# Patient Record
Sex: Male | Born: 1940 | Race: White | Hispanic: No | Marital: Married | Smoking: Former smoker
Health system: Southern US, Community
[De-identification: ages and names within clinical notes are randomized; demographics above are authoritative.]

## PROBLEM LIST (undated history)

## (undated) DIAGNOSIS — I700 Atherosclerosis of aorta: Secondary | ICD-10-CM

## (undated) DIAGNOSIS — I2510 Atherosclerotic heart disease of native coronary artery without angina pectoris: Secondary | ICD-10-CM

## (undated) DIAGNOSIS — Z8673 Personal history of transient ischemic attack (TIA), and cerebral infarction without residual deficits: Secondary | ICD-10-CM

## (undated) DIAGNOSIS — I639 Cerebral infarction, unspecified: Secondary | ICD-10-CM

## (undated) DIAGNOSIS — Z9289 Personal history of other medical treatment: Secondary | ICD-10-CM

## (undated) DIAGNOSIS — Z8679 Personal history of other diseases of the circulatory system: Secondary | ICD-10-CM

## (undated) DIAGNOSIS — I480 Paroxysmal atrial fibrillation: Secondary | ICD-10-CM

## (undated) DIAGNOSIS — H269 Unspecified cataract: Secondary | ICD-10-CM

## (undated) HISTORY — DX: Atherosclerosis of aorta: I70.0

## (undated) HISTORY — DX: Paroxysmal atrial fibrillation: I48.0

## (undated) HISTORY — PX: OTHER SURGICAL HISTORY: SHX169

## (undated) HISTORY — DX: Atherosclerotic heart disease of native coronary artery without angina pectoris: I25.10

## (undated) HISTORY — DX: Personal history of transient ischemic attack (TIA), and cerebral infarction without residual deficits: Z86.73

## (undated) HISTORY — DX: Cerebral infarction, unspecified: I63.9

## (undated) HISTORY — DX: Personal history of other diseases of the circulatory system: Z86.79

## (undated) HISTORY — DX: Personal history of other medical treatment: Z92.89

---

## 2017-01-02 DIAGNOSIS — Z8673 Personal history of transient ischemic attack (TIA), and cerebral infarction without residual deficits: Secondary | ICD-10-CM

## 2017-01-02 HISTORY — DX: Personal history of transient ischemic attack (TIA), and cerebral infarction without residual deficits: Z86.73

## 2017-01-19 ENCOUNTER — Other Ambulatory Visit: Payer: Self-pay

## 2017-01-19 ENCOUNTER — Emergency Department (HOSPITAL_COMMUNITY): Payer: Medicare HMO

## 2017-01-19 ENCOUNTER — Observation Stay (HOSPITAL_COMMUNITY): Payer: Medicare HMO

## 2017-01-19 ENCOUNTER — Encounter (HOSPITAL_COMMUNITY): Payer: Self-pay

## 2017-01-19 ENCOUNTER — Inpatient Hospital Stay (HOSPITAL_COMMUNITY)
Admission: EM | Admit: 2017-01-19 | Discharge: 2017-01-22 | DRG: 041 | Disposition: A | Discharge status: Home / Self Care | Payer: Medicare HMO | Attending: Internal Medicine | Admitting: Internal Medicine

## 2017-01-19 DIAGNOSIS — R2990 Unspecified symptoms and signs involving the nervous system: Secondary | ICD-10-CM | POA: Insufficient documentation

## 2017-01-19 DIAGNOSIS — R001 Bradycardia, unspecified: Secondary | ICD-10-CM | POA: Diagnosis not present

## 2017-01-19 DIAGNOSIS — E1022 Type 1 diabetes mellitus with diabetic chronic kidney disease: Secondary | ICD-10-CM | POA: Diagnosis present

## 2017-01-19 DIAGNOSIS — I69354 Hemiplegia and hemiparesis following cerebral infarction affecting left non-dominant side: Secondary | ICD-10-CM

## 2017-01-19 DIAGNOSIS — R402364 Coma scale, best motor response, obeys commands, 24 hours or more after hospital admission: Secondary | ICD-10-CM | POA: Diagnosis not present

## 2017-01-19 DIAGNOSIS — G459 Transient cerebral ischemic attack, unspecified: Secondary | ICD-10-CM | POA: Diagnosis not present

## 2017-01-19 DIAGNOSIS — N182 Chronic kidney disease, stage 2 (mild): Secondary | ICD-10-CM | POA: Diagnosis not present

## 2017-01-19 DIAGNOSIS — Z8679 Personal history of other diseases of the circulatory system: Secondary | ICD-10-CM

## 2017-01-19 DIAGNOSIS — R471 Dysarthria and anarthria: Secondary | ICD-10-CM | POA: Diagnosis present

## 2017-01-19 DIAGNOSIS — Z888 Allergy status to other drugs, medicaments and biological substances status: Secondary | ICD-10-CM

## 2017-01-19 DIAGNOSIS — Z6830 Body mass index (BMI) 30.0-30.9, adult: Secondary | ICD-10-CM

## 2017-01-19 DIAGNOSIS — R29701 NIHSS score 1: Secondary | ICD-10-CM | POA: Diagnosis present

## 2017-01-19 DIAGNOSIS — E1069 Type 1 diabetes mellitus with other specified complication: Secondary | ICD-10-CM | POA: Diagnosis present

## 2017-01-19 DIAGNOSIS — I129 Hypertensive chronic kidney disease with stage 1 through stage 4 chronic kidney disease, or unspecified chronic kidney disease: Secondary | ICD-10-CM | POA: Diagnosis present

## 2017-01-19 DIAGNOSIS — E669 Obesity, unspecified: Secondary | ICD-10-CM | POA: Diagnosis present

## 2017-01-19 DIAGNOSIS — I639 Cerebral infarction, unspecified: Principal | ICD-10-CM | POA: Diagnosis present

## 2017-01-19 DIAGNOSIS — E785 Hyperlipidemia, unspecified: Secondary | ICD-10-CM

## 2017-01-19 DIAGNOSIS — R29810 Facial weakness: Secondary | ICD-10-CM | POA: Diagnosis present

## 2017-01-19 DIAGNOSIS — R402254 Coma scale, best verbal response, oriented, 24 hours or more after hospital admission: Secondary | ICD-10-CM | POA: Diagnosis not present

## 2017-01-19 DIAGNOSIS — I63019 Cerebral infarction due to thrombosis of unspecified vertebral artery: Secondary | ICD-10-CM | POA: Diagnosis not present

## 2017-01-19 DIAGNOSIS — R2681 Unsteadiness on feet: Secondary | ICD-10-CM

## 2017-01-19 DIAGNOSIS — R0602 Shortness of breath: Secondary | ICD-10-CM

## 2017-01-19 DIAGNOSIS — Z87438 Personal history of other diseases of male genital organs: Secondary | ICD-10-CM | POA: Diagnosis not present

## 2017-01-19 DIAGNOSIS — Z794 Long term (current) use of insulin: Secondary | ICD-10-CM

## 2017-01-19 DIAGNOSIS — Z87891 Personal history of nicotine dependence: Secondary | ICD-10-CM

## 2017-01-19 DIAGNOSIS — R402144 Coma scale, eyes open, spontaneous, 24 hours or more after hospital admission: Secondary | ICD-10-CM | POA: Diagnosis not present

## 2017-01-19 HISTORY — DX: Unspecified cataract: H26.9

## 2017-01-19 LAB — ETHANOL: Alcohol, Ethyl (B): <10 mg/dL (ref <10)

## 2017-01-19 LAB — RAPID URINE DRUG SCREEN, HOSP PERFORMED
Amphetamines: NOT DETECTED
Barbiturates: NOT DETECTED
Benzodiazepines: NOT DETECTED
COCAINE: NOT DETECTED
OPIATES: NOT DETECTED
TETRAHYDROCANNABINOL: NOT DETECTED

## 2017-01-19 LAB — PROTIME-INR
INR: 1
PROTHROMBIN TIME: 13.1 s (ref 11.4–15.2)

## 2017-01-19 LAB — I-STAT CHEM 8, ED
BUN: 19 mg/dL (ref 6–20)
Calcium, Ion: 1.17 mmol/L (ref 1.15–1.40)
Chloride: 100 mmol/L — ABNORMAL LOW (ref 101–111)
Creatinine, Ser: 1.2 mg/dL (ref 0.61–1.24)
Glucose, Bld: 92 mg/dL (ref 65–99)
HEMATOCRIT: 43 % (ref 39.0–52.0)
HEMOGLOBIN: 14.6 g/dL (ref 13.0–17.0)
Potassium: 4.1 mmol/L (ref 3.5–5.1)
SODIUM: 142 mmol/L (ref 135–145)
TCO2: 31 mmol/L (ref 22–32)

## 2017-01-19 LAB — CBC
HCT: 43.3 % (ref 39.0–52.0)
Hemoglobin: 14.5 g/dL (ref 13.0–17.0)
MCH: 32.6 pg (ref 26.0–34.0)
MCHC: 33.5 g/dL (ref 30.0–36.0)
MCV: 97.3 fL (ref 78.0–100.0)
Platelets: 261 10*3/uL (ref 150–400)
RBC: 4.45 MIL/uL (ref 4.22–5.81)
RDW: 12.4 % (ref 11.5–15.5)
WBC: 7.8 10*3/uL (ref 4.0–10.5)

## 2017-01-19 LAB — COMPREHENSIVE METABOLIC PANEL
ALBUMIN: 3.9 g/dL (ref 3.5–5.0)
ALT: 17 U/L (ref 17–63)
ANION GAP: 8 (ref 5–15)
AST: 20 U/L (ref 15–41)
Alkaline Phosphatase: 44 U/L (ref 38–126)
BILIRUBIN TOTAL: 0.9 mg/dL (ref 0.3–1.2)
BUN: 12 mg/dL (ref 6–20)
CO2: 27 mmol/L (ref 22–32)
Calcium: 9.1 mg/dL (ref 8.9–10.3)
Chloride: 105 mmol/L (ref 101–111)
Creatinine, Ser: 1.19 mg/dL (ref 0.61–1.24)
GFR calc Af Amer: >60 mL/min (ref >60)
GFR calc non Af Amer: 58 mL/min — ABNORMAL LOW (ref >60)
GLUCOSE: 96 mg/dL (ref 65–99)
POTASSIUM: 3.8 mmol/L (ref 3.5–5.1)
Sodium: 140 mmol/L (ref 135–145)
TOTAL PROTEIN: 6.9 g/dL (ref 6.5–8.1)

## 2017-01-19 LAB — I-STAT TROPONIN, ED: Troponin i, poc: 0 ng/mL (ref 0.00–0.08)

## 2017-01-19 LAB — URINALYSIS, ROUTINE W REFLEX MICROSCOPIC
Bilirubin Urine: NEGATIVE
GLUCOSE, UA: NEGATIVE mg/dL
Hgb urine dipstick: NEGATIVE
KETONES UR: NEGATIVE mg/dL
LEUKOCYTES UA: NEGATIVE
Nitrite: NEGATIVE
PH: 7 (ref 5.0–8.0)
Protein, ur: NEGATIVE mg/dL
SPECIFIC GRAVITY, URINE: 1.005 (ref 1.005–1.030)

## 2017-01-19 LAB — DIFFERENTIAL
Basophils Absolute: 0 10*3/uL (ref 0.0–0.1)
Basophils Relative: 0 %
EOS ABS: 0.2 10*3/uL (ref 0.0–0.7)
EOS PCT: 3 %
Lymphocytes Relative: 22 %
Lymphs Abs: 1.7 10*3/uL (ref 0.7–4.0)
Monocytes Absolute: 0.5 10*3/uL (ref 0.1–1.0)
Monocytes Relative: 7 %
NEUTROS PCT: 68 %
Neutro Abs: 5.3 10*3/uL (ref 1.7–7.7)

## 2017-01-19 LAB — APTT: aPTT: 31 seconds (ref 24–36)

## 2017-01-19 MED ORDER — ACETAMINOPHEN 650 MG RE SUPP: 650.0000 mg | RECTAL | Status: DC | PRN

## 2017-01-19 MED ORDER — ACETAMINOPHEN 325 MG PO TABS: 650.0000 mg | ORAL_TABLET | ORAL | Status: DC | PRN

## 2017-01-19 MED ORDER — ENOXAPARIN SODIUM 40 MG/0.4ML ~~LOC~~ SOLN
40.0000 mg | SUBCUTANEOUS | Status: DC
  Administered 2017-01-19 – 2017-01-20 (×2): 40 mg via SUBCUTANEOUS
  Filled 2017-01-19 (×2): qty 0.4

## 2017-01-19 MED ORDER — SENNOSIDES-DOCUSATE SODIUM 8.6-50 MG PO TABS: 1.0000 | ORAL_TABLET | Freq: Every evening | ORAL | Status: DC | PRN

## 2017-01-19 MED ORDER — IOPAMIDOL (ISOVUE-370) INJECTION 76%
INTRAVENOUS | Status: AC
  Administered 2017-01-19: 50 mL
  Filled 2017-01-19: qty 50

## 2017-01-19 MED ORDER — ACETAMINOPHEN 160 MG/5ML PO SOLN: 650.0000 mg | ORAL | Status: DC | PRN

## 2017-01-19 MED ORDER — SODIUM CHLORIDE 0.9 % IV SOLN
INTRAVENOUS | Status: DC
  Administered 2017-01-19 – 2017-01-22 (×2): via INTRAVENOUS

## 2017-01-19 MED ORDER — STROKE: EARLY STAGES OF RECOVERY BOOK
Freq: Once | Status: AC
  Administered 2017-01-19: 21:00:00

## 2017-01-19 MED ORDER — ASPIRIN 325 MG PO TABS
325.0000 mg | ORAL_TABLET | Freq: Every day | ORAL | Status: DC
  Administered 2017-01-19 – 2017-01-22 (×4): 325 mg via ORAL
  Filled 2017-01-19 (×4): qty 1

## 2017-01-19 MED ORDER — ASPIRIN 300 MG RE SUPP: 300.0000 mg | Freq: Every day | RECTAL | Status: DC

## 2017-01-19 NOTE — ED Notes (Addendum)
MD bedside, calling code stroke despite unknown last known well

## 2017-01-19 NOTE — ED Notes (Signed)
Given report to [place] on 3W.

## 2017-01-19 NOTE — ED Provider Notes (Signed)
[facility] EMERGENCY DEPARTMENT Provider Note   CSN: [id number] Arrival date & time: 01/19/17  1405     History   Chief Complaint Chief Complaint  Patient presents with  . Code Stroke    HPI Boen Sterbenz is a 77 y.o. male.  77 year old male presents for evaluation of unsteady gait.  Patient was his normal self yesterday.  He went to bed around 9 PM.  He woke this morning with symptoms.  Family reports they noticed a mild left facial droop and some possible weakness of his left arm.  Patient denies other complaint.   The history is provided by the patient.  Cerebrovascular Accident  This is a new problem. The current episode started 6 to 12 hours ago. The problem occurs constantly. The problem has not changed since onset.Pertinent negatives include no chest pain and no abdominal pain. Nothing aggravates the symptoms. Nothing relieves the symptoms. He has tried nothing for the symptoms.    Past Medical History:  Diagnosis Date  . Cataract     There are no active problems to display for this patient.   History reviewed. No pertinent surgical history.     Home Medications    Prior to Admission medications   Not on File    Family History History reviewed. No pertinent family history.  Social History Social History   Tobacco Use  . Smoking status: Former Smoker    Packs/day: 3.00    Types: Cigarettes    Last attempt to quit: 01/19/1986    Years since quitting: 31.0  . Smokeless tobacco: Current User    Types: Chew  Substance Use Topics  . Alcohol use: No    Frequency: Never  . Drug use: No     Allergies   Novocain [procaine]   Review of Systems Review of Systems  Cardiovascular: Negative for chest pain.  Gastrointestinal: Negative for abdominal pain.  Neurological: Positive for weakness.  All other systems reviewed and are negative.    Physical Exam Updated Vital Signs BP (!) 142/87 (BP Location: Right Arm)   Pulse (!) 50    Temp 98.1 F (36.7 C) (Oral)   Resp 17   Ht 6\' 1"  (1.854 m)   Wt 104.3 kg (230 lb)   SpO2 98%   BMI 30.34 kg/m   Physical Exam  Constitutional: He is oriented to person, place, and time. He appears well-developed and well-nourished. No distress.  HENT:  Head: Normocephalic and atraumatic.  Mouth/Throat: Oropharynx is clear and moist.  Eyes: Conjunctivae and EOM are normal. Pupils are equal, round, and reactive to light.  Neck: Normal range of motion. Neck supple.  Cardiovascular: Normal rate, regular rhythm and normal heart sounds.  Pulmonary/Chest: Effort normal and breath sounds normal. No respiratory distress.  Abdominal: Soft. He exhibits no distension. There is no tenderness.  Musculoskeletal: Normal range of motion. He exhibits no edema or deformity.  Neurological: He is alert and oriented to person, place, and time.  Mild left facial droop.  Slight left upper extremity weakness.    Skin: Skin is warm and dry.  Psychiatric: He has a normal mood and affect.  Nursing note and vitals reviewed.    ED Treatments / Results  Labs (all labs ordered are listed, but only abnormal results are displayed) Labs Reviewed  COMPREHENSIVE METABOLIC PANEL - Abnormal; Notable for the following components:      Result Value   GFR calc non Af Amer 58 (*)    All other components  within normal limits  I-STAT CHEM 8, ED - Abnormal; Notable for the following components:   Chloride 100 (*)    All other components within normal limits  ETHANOL  PROTIME-INR  APTT  CBC  DIFFERENTIAL  RAPID URINE DRUG SCREEN, HOSP PERFORMED  URINALYSIS, ROUTINE W REFLEX MICROSCOPIC  I-STAT TROPONIN, ED    EKG  EKG Interpretation  Date/Time:  Friday January 19 2017 14:09:21 EST Ventricular Rate:  51 PR Interval:    QRS Duration: 163 QT Interval:  439 QTC Calculation: 405 R Axis:   5 Text Interpretation:  Sinus rhythm Right bundle branch block Confirmed by Kristine RoyalMessick, Newel Oien [phone]([id number]) on 01/19/2017  4:21:28 PM       Radiology Ct Head Code Stroke Wo Contrast  Result Date: 01/19/2017 CLINICAL DATA:  Code stroke. Focal neuro deficit greater than 6 hours. Left-sided weakness and facial droop EXAM: CT HEAD WITHOUT CONTRAST TECHNIQUE: Contiguous axial images were obtained from the base of the skull through the vertex without intravenous contrast. COMPARISON:  None. FINDINGS: Brain: Ventricle size and cerebral volume normal for age. Negative for acute infarct, hemorrhage, or mass. No fluid collection or shift of the midline structures. Vascular: Negative for hyperdense vessel Skull: Negative Sinuses/Orbits: Mild mucosal edema paranasal sinuses. Right cataract removal. No orbital mass. Other: None ASPECTS (Alberta Stroke Program Early CT Score) - Ganglionic level infarction (caudate, lentiform nuclei, internal capsule, insula, M1-M3 cortex): 7 - Supraganglionic infarction (M4-M6 cortex): 3 Total score (0-10 with 10 being normal): 10 IMPRESSION: 1. No acute abnormality 2. ASPECTS is 10 3. I paged the stroke neurologist at this time. Electronically Signed   By: Marlan Palauharles  Clark M.D.   On: 01/19/2017 14:59    Procedures Procedures (including critical care time)  Medications Ordered in ED Medications - No data to display   Initial Impression / Assessment and Plan / ED Course  I have reviewed the triage vital signs and the nursing notes.  Pertinent labs & imaging results that were available during my care of the patient were reviewed by me and considered in my medical decision making (see chart for details).      MDM screen complete  Patient is presenting with possible code stroke.  Patient's symptoms are not within the timeframe for TPA.  Patient will be admitted for further workup and evaluation.   Final Clinical Impressions(s) / ED Diagnoses   Final diagnoses:  Unsteady gait    ED Discharge Orders    None       Wynetta FinesMessick, Prescilla Monger C, MD 01/19/17 1622

## 2017-01-19 NOTE — ED Triage Notes (Signed)
Pt arrives to ED from home via EMS with complaints of stroke like symptoms including left sided facial droop, leaning left with ambulation, and speech "slightly off on some words" since this morning. Last known well was last night, wife noticed pt was not himself this morning. Pt placed in position of comfort with bed locked and lowered, call bell in reach.

## 2017-01-19 NOTE — Consult Note (Signed)
Referring Physician: Dr. Rodena MedinMessick    Chief Complaint: Left sided weakness  HPI: Victor Mcclain is an 77 y.o. male presenting via EMS from home with new onset of dysarthria, left facial droop and left arm weakness. LKN was 9:00 PM on Thursday. He also endorsed leaning towards the left with ambulation and speech "slightly off on some words" since this morning. His wife noticed that the patient was not himself this morning. He states that he is not on ASA or Plavix. He is not on a blood thinner.   LSN: 9:00 PM on Thursday tPA Given: No: Out of time window  Past Medical History:  Diagnosis Date  . Cataract     History reviewed. No pertinent surgical history.  History reviewed. No pertinent family history. Social History:  reports that he quit smoking about 31 years ago. His smoking use included cigarettes. He smoked 3.00 packs per day. His smokeless tobacco use includes chew. He reports that he does not drink alcohol or use drugs.  Allergies:  Allergies  Allergen Reactions  . Novocain [Procaine] Anaphylaxis    And any similar meds    Medications: Prior to Admission:  (Not in a hospital admission)  ROS: Detailed ROS deferred in the context of acute stroke evaluation. See HPI for stroke symptoms.   Physical Examination: Blood pressure (!) 142/87, pulse (!) 50, temperature 98.1 F (36.7 C), temperature source Oral, resp. rate 17, height 6\' 1"  (1.854 m), weight 104.3 kg (230 lb), SpO2 98 %.  HEENT: [place]/AT Lungs: Respirations unlabored Ext: Warm and well perfused  Neurologic Examination: Mental Status: Alert, fully oriented, thought content appropriate.  Speech fluent with intact comprehension, naming and repetition.  Able to follow all commands without difficulty. No dysarthria noted.  Cranial Nerves: II:  Visual fields intact all 4 quadrants OU. No extinction to DSS. PERRL.   III,IV, VI: EOMI without nystagmus. No ptosis.  V,VII: Subtle left facial droop. Facial temp sensation equal  bilaterally.  VIII: hearing intact to voice IX,X: Palate elevates symmetrically XI: Symmetric XII: midline tongue extension  Motor: Right : Upper extremity   5/5    Left:     Upper extremity   5/5  Lower extremity   5/5     Lower extremity   5/5 No pronator drift.  Sensory: Temp and light touch intact x 4 without extinction Deep Tendon Reflexes: 2+ bilateral upper extremities and patellae. Toes downgoing.  Cerebellar: No ataxia with FNF bilaterally.  Gait: Deferred  Results for orders placed or performed during the hospital encounter of 01/19/17 (from the past 48 hour(s))  I-stat troponin, ED     Status: None   Collection Time: 01/19/17  2:47 PM  Result Value Ref Range   Troponin i, poc 0.00 0.00 - 0.08 ng/mL   Comment 3            Comment: Due to the release kinetics of cTnI, a negative result within the first hours of the onset of symptoms does not rule out myocardial infarction with certainty. If myocardial infarction is still suspected, repeat the test at appropriate intervals.   I-Stat Chem 8, ED     Status: Abnormal   Collection Time: 01/19/17  2:48 PM  Result Value Ref Range   Sodium 142 135 - 145 mmol/L   Potassium 4.1 3.5 - 5.1 mmol/L   Chloride 100 (L) 101 - 111 mmol/L   BUN 19 6 - 20 mg/dL   Creatinine, Ser 1.611.20 0.61 - 1.24 mg/dL   Glucose,  Bld 92 65 - 99 mg/dL   Calcium, Ion 1.32 4.40 - 1.40 mmol/L   TCO2 31 22 - 32 mmol/L   Hemoglobin 14.6 13.0 - 17.0 g/dL   HCT 10.2 72.5 - 36.6 %   No results found.  Assessment: 77 y.o. male presenting with dysarthria, left facial droop and left sided weakness 1. At time of acute Neurological assessment, the only deficit seen on exam is subtle left facial droop. Presentation most consistent with TIA versus small ischemic infarction. Not an endovascular candidate due to low NIHSS of 1. 2. CT head negative for acute abnormality 3. Stroke Risk Factors - None  Plan: 1. HgbA1c, fasting lipid panel 2. MRI, MRA of the brain  without contrast 3. PT consult, OT consult, Speech consult 4. Echocardiogram 5. Carotid dopplers 6. Load with ASA 650 mg PO x 1, the start scheduled ASA 81 mg po qd 7. Consider statin if stroke seen on MRI brain 8. Telemetry monitoring 9. Frequent neuro checks 10. Permissive HTN x 24 hours  @Electronically  signed: Dr. Caryl Pina  01/19/2017, 3:01 PM

## 2017-01-19 NOTE — H&P (Signed)
History and Physical    Jon Lall ZOX:[id number] DOB: December 21, 1940 DOA: 01/19/2017  PCP: System, Pcp Not In   Patient coming from: Home  Chief Complaint: Unsteady Gait, Falling to Left side with Left side weakness, and Left facial droop  HPI: Victor Mcclain is a 77 y.o. male with medical history significant of previously treated HTN (no longer on medications), Hx of BPH s/p what seems like a TURP, Hx of Renal Failure a few years ago requiring Hospitalization at [facility], and other comorbids who presented to [facility] ED with a cc of Unsteady Gait/Ataxia, Falling to the left side, with left sided weakness and Left facial droop. Last known well time was 9:00 pm last night. Patient woke up at 9:00 am this AM and wife stated that he was unsteady on his feet and was falling to the left side. She also noticed patient to have a facial droop. She also states he was not coordinated and because of concern patient was brought to the ED for evaluation. Patient's left facial droop has improved per wife but still remains slightly weak. TRH was called to evaluate for Stroke Like Symptoms and admit for TIA vs. CVA. Neurology was called by EDP and evaluated patient in ED.  ED Course: Had Code Stroke Called, Neurology evaluated and patient had basic blood work and Head CT Done.   Review of Systems: As per HPI otherwise 10 point review of systems negative. Denied CP, SOB, N/V, Abdominal Pain, Lightheadedness or dizziness.  Past Medical History:  Diagnosis Date  . Cataract   -Hx of HTN no longer on Medication -Hx of Enlarged Prostate -Hx of Renal Failure   SURGICAL HISTORY History reviewed. Patient had a Surgical Procedure for BPH; Likely TURP  SOCIAL HISTORY  reports that he quit smoking about 31 years ago. His smoking use included cigarettes. He smoked 3.00 packs per day. His smokeless tobacco use includes chew. He reports that he does not drink alcohol or use drugs.  Allergies  Allergen  Reactions  . Novocain [Procaine] Anaphylaxis    And any similar meds   FAMILY HISTORY History reviewed. Paient's mother had multiple CVA's and died from a CVA. Patient's Father had Bladder Cancer  Prior to Admission medications   Not on File    Physical Exam: Vitals:   01/19/17 1406 01/19/17 1410 01/19/17 1411  BP:  (!) 142/87   Pulse:  (!) 50   Resp:  17   Temp:  98.1 F (36.7 C)   TempSrc:  Oral   SpO2: 96% 98%   Weight:   104.3 kg (230 lb)  Height:   6\' 1"  (1.854 m)   Constitutional: WN/WD Caucasian Male in, NAD and appears calm  Eyes: PERRL, Lids and conjunctivae normal, sclerae anicteric  ENMT: External Ears, Nose appear normal. Grossly normal hearing. Mucous membranes are moist. Mild facial droop.  Neck: Appears normal, supple, no cervical masses, normal ROM, no appreciable thyromegaly, no JVD Respiratory: Clear to auscultation bilaterally, no wheezing, rales, rhonchi or crackles. Normal respiratory effort and patient is not tachypenic. No accessory muscle use.  Cardiovascular: RRR and bradycardic, no murmurs / rubs / gallops. S1 and S2 auscultated. No extremity edema.  Abdomen: Soft, non-tender, non-distended. No masses palpated. No appreciable hepatosplenomegaly. Bowel sounds positive x4.  GU: Deferred. Musculoskeletal: No clubbing / cyanosis of digits/nails. No joint deformity upper and lower extremities. Good ROM, no contractures. Diminished strength in Left upper Extremity  Skin: No rashes, lesions, ulcers. No induration; Warm and dry.  Neurologic: CN 2-12 grossly intact with no focal deficits except slight facial droop. Sensation intact in all 4 Extremities, Strength diminished in Left Upper Extremity. Romberg sign cerebellar reflexes not assessed.  Psychiatric: Normal judgment and insight. Alert and awake. Oriented x 3. Withdrawn mood and flat affect.   Labs on Admission: I have personally reviewed following labs and imaging studies  CBC: Recent Labs  Lab  01/19/17 1435 01/19/17 1448  WBC 7.8  --   NEUTROABS 5.3  --   HGB 14.5 14.6  HCT 43.3 43.0  MCV 97.3  --   PLT 261  --    Basic Metabolic Panel: Recent Labs  Lab 01/19/17 1435 01/19/17 1448  NA 140 142  K 3.8 4.1  CL 105 100*  CO2 27  --   GLUCOSE 96 92  BUN 12 19  CREATININE 1.19 1.20  CALCIUM 9.1  --    GFR: Estimated Creatinine Clearance: 68.5 mL/min (by C-G formula based on SCr of 1.2 mg/dL). Liver Function Tests: Recent Labs  Lab 01/19/17 1435  AST 20  ALT 17  ALKPHOS 44  BILITOT 0.9  PROT 6.9  ALBUMIN 3.9   No results for input(s): LIPASE, AMYLASE in the last 168 hours. No results for input(s): AMMONIA in the last 168 hours. Coagulation Profile: Recent Labs  Lab 01/19/17 1435  INR 1.00   Cardiac Enzymes: No results for input(s): CKTOTAL, CKMB, CKMBINDEX, TROPONINI in the last 168 hours. BNP (last 3 results) No results for input(s): PROBNP in the last 8760 hours. HbA1C: No results for input(s): HGBA1C in the last 72 hours. CBG: No results for input(s): GLUCAP in the last 168 hours. Lipid Profile: No results for input(s): CHOL, HDL, LDLCALC, TRIG, CHOLHDL, LDLDIRECT in the last 72 hours. Thyroid Function Tests: No results for input(s): TSH, T4TOTAL, FREET4, T3FREE, THYROIDAB in the last 72 hours. Anemia Panel: No results for input(s): VITAMINB12, FOLATE, FERRITIN, TIBC, IRON, RETICCTPCT in the last 72 hours. Urine analysis:    Component Value Date/Time   COLORURINE STRAW (A) 01/19/2017 1513   APPEARANCEUR CLEAR 01/19/2017 1513   LABSPEC 1.005 01/19/2017 1513   PHURINE 7.0 01/19/2017 1513   GLUCOSEU NEGATIVE 01/19/2017 1513   HGBUR NEGATIVE 01/19/2017 1513   BILIRUBINUR NEGATIVE 01/19/2017 1513   KETONESUR NEGATIVE 01/19/2017 1513   PROTEINUR NEGATIVE 01/19/2017 1513   NITRITE NEGATIVE 01/19/2017 1513   LEUKOCYTESUR NEGATIVE 01/19/2017 1513   Sepsis Labs:  !!!!!!!!!!!!!!!!!!!!!!!!!!!!!!!!!!!!!!!!!!!! @LABRCNTIP (procalcitonin:4,lacticidven:4) )No results found for this or any previous visit (from the past 240 hour(s)).   Radiological Exams on Admission: Ct Head Code Stroke Wo Contrast  Addendum Date: 01/19/2017   ADDENDUM REPORT: 01/19/2017 15:46 ADDENDUM: These results were called by telephone at the time of interpretation on 01/19/2017 at 3:46 pm to Dr. Otelia LimesLindzen, who verbally acknowledged these results. Electronically Signed   By: Marlan Palauharles  Clark M.D.   On: 01/19/2017 15:46   Result Date: 01/19/2017 CLINICAL DATA:  Code stroke. Focal neuro deficit greater than 6 hours. Left-sided weakness and facial droop EXAM: CT HEAD WITHOUT CONTRAST TECHNIQUE: Contiguous axial images were obtained from the base of the skull through the vertex without intravenous contrast. COMPARISON:  None. FINDINGS: Brain: Ventricle size and cerebral volume normal for age. Negative for acute infarct, hemorrhage, or mass. No fluid collection or shift of the midline structures. Vascular: Negative for hyperdense vessel Skull: Negative Sinuses/Orbits: Mild mucosal edema paranasal sinuses. Right cataract removal. No orbital mass. Other: None ASPECTS (Alberta Stroke Program Early CT Score) - Ganglionic level infarction (caudate,  lentiform nuclei, internal capsule, insula, M1-M3 cortex): 7 - Supraganglionic infarction (M4-M6 cortex): 3 Total score (0-10 with 10 being normal): 10 IMPRESSION: 1. No acute abnormality 2. ASPECTS is 10 3. I paged the stroke neurologist at this time. Electronically Signed: By: Marlan Palau M.D. On: 01/19/2017 14:59    EKG: Independently reviewed. Showed Sinus Bradycardia with no appreciable ST Elevation on my interpretation.  Assessment/Plan Active Problems:   Stroke-like symptoms   Bradycardia   History of hypertension   History of BPH   Former smoker  Acute Neurological Symptoms of Left Sided weakness and Facial Droop, Unsteady Gait Concern for TIA vs  CVA -Place in Tele Observation -TIA/CVA Pathway -Head CT Negative for Intracranial Abnormalities -Neurology consulted by EDP; Appreciate Recc's -Check Carotid Dopplers, MRI/MRA of Head, Transthoracic ECHOCardiogram; Also check CTA Head/Neck  -Check FLP, and HbA1c; Based on FLP start Statin  -Started ASA  -Neurochecks per Protocol -NPO Until Stroke Swallow Screen and SLP -PT/OT/SLP Consultation -Allow for permissive HTN -Check UDS (pending)  Bradycardia -Asymptomatic  -Continue to Monitor on Telemetry   Hx of HTN -No Longer on Antihypertensives -Allow for Permissive HTN  Hx of BPH -s/p TURP -Continue to Monitor  Prior Smoker -Not an Active Issue  DVT prophylaxis: Lovenox 40 mg sq q24h Code Status: FULL CODE Family Communication: Discussed with wife at bedside Disposition Plan: Pending PT/OT Evaluations Consults called: Neurology Admission status: Obs Telemetry   Severity of Illness: The appropriate patient status for this patient is OBSERVATION. Observation status is judged to be reasonable and necessary in order to provide the required intensity of service to ensure the patient's safety. The patient's presenting symptoms, physical exam findings, and initial radiographic and laboratory data in the context of their medical condition is felt to place them at decreased risk for further clinical deterioration. Furthermore, it is anticipated that the patient will be medically stable for discharge from the hospital within 2 midnights of admission. The following factors support the patient status of observation.   " The patient's presenting symptoms include weakness, facial droop, and ataxia. " The physical exam findings include Bradycardia. " The initial radiographic and laboratory data are Normal.  Merlene Laughter, D.O. [facility] Pager [phone]  If 7PM-7AM, please contact night-coverage [email or web address] Password [facility]  01/19/2017, 4:51 PM

## 2017-01-19 NOTE — Code Documentation (Signed)
77yo [facility] at [phone].  Patient from home where he was LKW yesterday at 2100.  Patient woke up this morning with left sided deficits noticed by his wife around 0900, normally gets up at 0700 per family.  Code stroke called in the ED for left facial droop and weakness.  Stroke team to the bedside. Patient to CT with team.  CT completed.  NIHSS 2, see documentation for details and code stroke times.  Patient with slight left facial asymmetry and mild dysarthria.  Patient is outside the window for treatment with tPA.  Patient is not an endovascular intervention candidate at this time.  Patient back to C26.  Patient to have MRI.  Bedside handoff with ED RN Aundra MilletMegan.

## 2017-01-19 NOTE — ED Notes (Signed)
[facility] called @ 1456-Code Stroke Cancel-per DR. Messick

## 2017-01-20 ENCOUNTER — Observation Stay (HOSPITAL_COMMUNITY): Payer: Medicare HMO

## 2017-01-20 ENCOUNTER — Encounter (HOSPITAL_COMMUNITY): Payer: Self-pay | Admitting: Internal Medicine

## 2017-01-20 DIAGNOSIS — R402254 Coma scale, best verbal response, oriented, 24 hours or more after hospital admission: Secondary | ICD-10-CM | POA: Diagnosis not present

## 2017-01-20 DIAGNOSIS — R29810 Facial weakness: Secondary | ICD-10-CM | POA: Diagnosis present

## 2017-01-20 DIAGNOSIS — Z87891 Personal history of nicotine dependence: Secondary | ICD-10-CM | POA: Diagnosis not present

## 2017-01-20 DIAGNOSIS — Z794 Long term (current) use of insulin: Secondary | ICD-10-CM | POA: Diagnosis not present

## 2017-01-20 DIAGNOSIS — G459 Transient cerebral ischemic attack, unspecified: Secondary | ICD-10-CM | POA: Diagnosis not present

## 2017-01-20 DIAGNOSIS — I129 Hypertensive chronic kidney disease with stage 1 through stage 4 chronic kidney disease, or unspecified chronic kidney disease: Secondary | ICD-10-CM | POA: Diagnosis present

## 2017-01-20 DIAGNOSIS — E1069 Type 1 diabetes mellitus with other specified complication: Secondary | ICD-10-CM | POA: Insufficient documentation

## 2017-01-20 DIAGNOSIS — I361 Nonrheumatic tricuspid (valve) insufficiency: Secondary | ICD-10-CM

## 2017-01-20 DIAGNOSIS — I639 Cerebral infarction, unspecified: Secondary | ICD-10-CM | POA: Diagnosis present

## 2017-01-20 DIAGNOSIS — R2681 Unsteadiness on feet: Secondary | ICD-10-CM | POA: Diagnosis present

## 2017-01-20 DIAGNOSIS — R402364 Coma scale, best motor response, obeys commands, 24 hours or more after hospital admission: Secondary | ICD-10-CM | POA: Diagnosis not present

## 2017-01-20 DIAGNOSIS — I6389 Other cerebral infarction: Secondary | ICD-10-CM | POA: Diagnosis not present

## 2017-01-20 DIAGNOSIS — R29701 NIHSS score 1: Secondary | ICD-10-CM | POA: Diagnosis present

## 2017-01-20 DIAGNOSIS — I69354 Hemiplegia and hemiparesis following cerebral infarction affecting left non-dominant side: Secondary | ICD-10-CM | POA: Diagnosis not present

## 2017-01-20 DIAGNOSIS — N182 Chronic kidney disease, stage 2 (mild): Secondary | ICD-10-CM | POA: Diagnosis present

## 2017-01-20 DIAGNOSIS — Z888 Allergy status to other drugs, medicaments and biological substances status: Secondary | ICD-10-CM | POA: Diagnosis not present

## 2017-01-20 DIAGNOSIS — Z87438 Personal history of other diseases of male genital organs: Secondary | ICD-10-CM | POA: Diagnosis not present

## 2017-01-20 DIAGNOSIS — R471 Dysarthria and anarthria: Secondary | ICD-10-CM | POA: Diagnosis present

## 2017-01-20 DIAGNOSIS — I6300 Cerebral infarction due to thrombosis of unspecified precerebral artery: Secondary | ICD-10-CM | POA: Diagnosis not present

## 2017-01-20 DIAGNOSIS — Z6830 Body mass index (BMI) 30.0-30.9, adult: Secondary | ICD-10-CM | POA: Diagnosis not present

## 2017-01-20 DIAGNOSIS — E669 Obesity, unspecified: Secondary | ICD-10-CM | POA: Diagnosis present

## 2017-01-20 DIAGNOSIS — E785 Hyperlipidemia, unspecified: Secondary | ICD-10-CM

## 2017-01-20 DIAGNOSIS — R2990 Unspecified symptoms and signs involving the nervous system: Secondary | ICD-10-CM

## 2017-01-20 DIAGNOSIS — E1022 Type 1 diabetes mellitus with diabetic chronic kidney disease: Secondary | ICD-10-CM | POA: Diagnosis present

## 2017-01-20 DIAGNOSIS — R001 Bradycardia, unspecified: Secondary | ICD-10-CM | POA: Diagnosis present

## 2017-01-20 DIAGNOSIS — I340 Nonrheumatic mitral (valve) insufficiency: Secondary | ICD-10-CM | POA: Diagnosis not present

## 2017-01-20 DIAGNOSIS — I63019 Cerebral infarction due to thrombosis of unspecified vertebral artery: Secondary | ICD-10-CM | POA: Diagnosis not present

## 2017-01-20 DIAGNOSIS — R402144 Coma scale, eyes open, spontaneous, 24 hours or more after hospital admission: Secondary | ICD-10-CM | POA: Diagnosis not present

## 2017-01-20 LAB — VAS US CAROTID
LCCADDIAS: -12 cm/s
LCCADSYS: -78 cm/s
LCCAPDIAS: 23 cm/s
LCCAPSYS: 95 cm/s
LEFT ECA DIAS: -11 cm/s
LEFT VERTEBRAL DIAS: 6 cm/s
LICADSYS: -68 cm/s
Left ICA dist dias: -26 cm/s
Left ICA prox dias: -17 cm/s
Left ICA prox sys: -47 cm/s
RCCADSYS: -55 cm/s
RCCAPSYS: 61 cm/s
RIGHT ECA DIAS: -12 cm/s
RIGHT VERTEBRAL DIAS: 14 cm/s
Right CCA prox dias: 16 cm/s

## 2017-01-20 LAB — CBC WITH DIFFERENTIAL/PLATELET
BASOS PCT: 1 %
Basophils Absolute: 0.1 10*3/uL (ref 0.0–0.1)
Eosinophils Absolute: 0.3 10*3/uL (ref 0.0–0.7)
Eosinophils Relative: 3 %
HEMATOCRIT: 42.4 % (ref 39.0–52.0)
Hemoglobin: 14.7 g/dL (ref 13.0–17.0)
LYMPHS ABS: 1.7 10*3/uL (ref 0.7–4.0)
Lymphocytes Relative: 21 %
MCH: 33.6 pg (ref 26.0–34.0)
MCHC: 34.7 g/dL (ref 30.0–36.0)
MCV: 97 fL (ref 78.0–100.0)
MONOS PCT: 10 %
Monocytes Absolute: 0.8 10*3/uL (ref 0.1–1.0)
NEUTROS ABS: 5.1 10*3/uL (ref 1.7–7.7)
Neutrophils Relative %: 65 %
Platelets: 256 10*3/uL (ref 150–400)
RBC: 4.37 MIL/uL (ref 4.22–5.81)
RDW: 12.6 % (ref 11.5–15.5)
WBC: 7.8 10*3/uL (ref 4.0–10.5)

## 2017-01-20 LAB — COMPREHENSIVE METABOLIC PANEL
ALBUMIN: 3.7 g/dL (ref 3.5–5.0)
ALT: 15 U/L — ABNORMAL LOW (ref 17–63)
ANION GAP: 10 (ref 5–15)
AST: 18 U/L (ref 15–41)
Alkaline Phosphatase: 44 U/L (ref 38–126)
BILIRUBIN TOTAL: 1 mg/dL (ref 0.3–1.2)
BUN: 15 mg/dL (ref 6–20)
CALCIUM: 9 mg/dL (ref 8.9–10.3)
CO2: 24 mmol/L (ref 22–32)
Chloride: 104 mmol/L (ref 101–111)
Creatinine, Ser: 1.13 mg/dL (ref 0.61–1.24)
GFR calc non Af Amer: >60 mL/min (ref >60)
GLUCOSE: 101 mg/dL — AB (ref 65–99)
POTASSIUM: 3.5 mmol/L (ref 3.5–5.1)
Sodium: 138 mmol/L (ref 135–145)
TOTAL PROTEIN: 6.5 g/dL (ref 6.5–8.1)

## 2017-01-20 LAB — LIPID PANEL
Cholesterol: 167 mg/dL (ref 0–200)
HDL: 37 mg/dL — AB (ref >40)
LDL CALC: 107 mg/dL — AB (ref 0–99)
Total CHOL/HDL Ratio: 4.5 RATIO
Triglycerides: 115 mg/dL (ref <150)
VLDL: 23 mg/dL (ref 0–40)

## 2017-01-20 LAB — MAGNESIUM: MAGNESIUM: 2.2 mg/dL (ref 1.7–2.4)

## 2017-01-20 LAB — ECHOCARDIOGRAM COMPLETE
HEIGHTINCHES: 73 in
WEIGHTICAEL: 3679.04 [oz_av]

## 2017-01-20 LAB — PHOSPHORUS: PHOSPHORUS: 3.8 mg/dL (ref 2.5–4.6)

## 2017-01-20 LAB — HEMOGLOBIN A1C
HEMOGLOBIN A1C: 5.4 % (ref 4.8–5.6)
MEAN PLASMA GLUCOSE: 108.28 mg/dL

## 2017-01-20 MED ORDER — ATORVASTATIN CALCIUM 40 MG PO TABS
40.0000 mg | ORAL_TABLET | Freq: Every day | ORAL | Status: DC
  Administered 2017-01-20 – 2017-01-22 (×3): 40 mg via ORAL
  Filled 2017-01-20 (×3): qty 1

## 2017-01-20 NOTE — Progress Notes (Signed)
PT Cancellation Note  Patient Details Name: Victor Mcclain MRN: [id number] DOB: March 04, 1940   Cancelled Treatment:    Reason Eval/Treat Not Completed: Patient at procedure or test/unavailable Visited patient at 1120, ECHO about to be performed, will visit later today as time permits.   Etta GrandchildSean Lamario Mani, PT, DPT Acute Rehab Services Pager: [phone]     Etta GrandchildSean  Aizen Duval 01/20/2017, 11:24 AM

## 2017-01-20 NOTE — Evaluation (Signed)
Physical Therapy Evaluation Patient Details Name: Victor Mcclain MRN: [id number] DOB: 1940-03-20 Today's Date: 01/20/2017   History of Present Illness  77 y.o. male with medical history significant of previously treated HTN (no longer on medications), Hx of BPH s/p what seems like a TURP, Hx of Renal Failure a few years ago requiring Hospitalization at [facility], and other comorbids who presented to [facility] ED with a cc of Unsteady Gait/Ataxia, Falling to the left side, with left sided weakness and Left facial droop. 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    Clinical Impression  Pt admitted with above diagnosis. Pt currently with functional limitations due to the deficits listed below (see PT Problem List). PTA, patient was living with wife, independent with all mobility, driving, and working in his own auto-body business. Pt lives in 1 story home with level entry. Upon evaluation, pt presents with L sided hemiparesis, cognitive and balance deficits that limit his safe mobility. Min Guard level of support needed for OOB mobility, and able to ambulate long distances in hallway with RW with mild path deviation and no LOB. Discussed PT considerations with wife and patient, wife agrees to get patient to OP neuro PT, where I anticipate he will do very well.  Pt will benefit from skilled PT to increase their independence and safety with mobility to allow discharge to the venue listed below.      Follow Up Recommendations Outpatient PT;Supervision/Assistance - 24 hour    Equipment Recommendations  Rolling walker with 5" wheels;3in1 (PT)    Recommendations for Other Services       Precautions / Restrictions Precautions Precautions: Fall Restrictions Weight Bearing Restrictions: No      Mobility  Bed Mobility Overal bed mobility: Needs Assistance Bed Mobility: Supine to Sit     Supine to sit: Supervision;HOB  elevated     General bed mobility comments: Supervision for safety. Required increased time  Transfers Overall transfer level: Needs assistance Equipment used: None;Rolling walker (2 wheeled) Transfers: Sit to/from Stand Sit to Stand: Min assist         General transfer comment: Cues for hand placement on RW. min A to help power up and stabilize.   Ambulation/Gait Ambulation/Gait assistance: Min guard Ambulation Distance (Feet): 350 Feet Assistive device: Rolling walker (2 wheeled) Gait Pattern/deviations: Step-through pattern Gait velocity: normal   General Gait Details: normal velocity, step through gait. occasional left path deviation, but overall stable inside RW with min guard for safety. Pt quick and impulsive with movements cued to slow down at times. advised wife to supervise patient with all OOB mobility starting off.   Stairs            Wheelchair Mobility    Modified Rankin (Stroke Patients Only)       Balance Overall balance assessment: Needs assistance Sitting-balance support: No upper extremity supported;Feet supported Sitting balance-Leahy Scale: Fair     Standing balance support: During functional activity;Bilateral upper extremity supported Standing balance-Leahy Scale: Fair                               Pertinent Vitals/Pain Pain Assessment: No/denies pain    Home Living Family/patient expects to be discharged to:: Private residence Living Arrangements: Spouse/significant other Available Help at Discharge: Family;Available 24 hours/day Type of Home: House     Entrance Stairs-Number of Steps: level entrance in front  Home  Layout: One level Home Equipment: Shower seat - built in      Prior Function Level of Independence: Independent         Comments: ADLs, IADLs, driving, and working full time and owner of [employer]   Dominant Hand: Right    Extremity/Trunk Assessment   Upper Extremity  Assessment Upper Extremity Assessment: Defer to OT evaluation    Lower Extremity Assessment Lower Extremity Assessment: (RLE strength 5/5  LLE strenth 4/5 gross)    Cervical / Trunk Assessment Cervical / Trunk Assessment: Normal;Other exceptions Cervical / Trunk Exceptions: Noted left lateral lean and forward flexion in sitting  Communication   Communication: HOH  Cognition Arousal/Alertness: Awake/alert Behavior During Therapy: WFL for tasks assessed/performed Overall Cognitive Status: Impaired/Different from baseline Area of Impairment: Following commands;Safety/judgement;Problem solving;Awareness                       Following Commands: Follows multi-step commands with increased time;Follows one step commands with increased time Safety/Judgement: Decreased awareness of safety;Decreased awareness of deficits Awareness: Emergent Problem Solving: Slow processing;Requires verbal cues General Comments: Pt required increased time throughout session for slower processing as seen during functional tasks and answering PLOF questions.       General Comments General comments (skin integrity, edema, etc.): poor coordination in rapid alternating movements. wife son and grandaughter present for session. discussion for [facility], OP neuro PT benefits, and encourged patient to use L arm as much as possible for eating and drinking during session.     Exercises     Assessment/Plan    PT Assessment Patient needs continued PT services  PT Problem List Decreased strength;Decreased range of motion;Decreased activity tolerance;Decreased balance;Decreased mobility;Decreased coordination       PT Treatment Interventions DME instruction;Gait training;Stair training;Functional mobility training;Therapeutic activities;Therapeutic exercise;Balance training;Neuromuscular re-education    PT Goals (Current goals can be found in the Care Plan section)  Acute Rehab PT Goals Patient Stated Goal: Go  home soon PT Goal Formulation: With patient Time For Goal Achievement: 01/27/17 Potential to Achieve Goals: Good    Frequency Min 4X/week   Barriers to discharge        Co-evaluation               AM-PAC PT "6 Clicks" Daily Activity  Outcome Measure Difficulty turning over in bed (including adjusting bedclothes, sheets and blankets)?: A Little Difficulty moving from lying on back to sitting on the side of the bed? : A Little Difficulty sitting down on and standing up from a chair with arms (e.g., wheelchair, bedside commode, etc,.)?: A Little Help needed moving to and from a bed to chair (including a wheelchair)?: A Little Help needed walking in hospital room?: A Little Help needed climbing 3-5 steps with a railing? : A Lot 6 Click Score: 17    End of Session Equipment Utilized During Treatment: Gait belt       PT Visit Diagnosis: Unsteadiness on feet (R26.81);Other abnormalities of gait and mobility (R26.89);Other symptoms and signs involving the nervous system (R29.898);Hemiplegia and hemiparesis Hemiplegia - Right/Left: Left Hemiplegia - dominant/non-dominant: Non-dominant Hemiplegia - caused by: Cerebral infarction    Time: 1355-1427 PT Time Calculation (min) (ACUTE ONLY): 32 min   Charges:   PT Evaluation $PT Eval Low Complexity: 1 Low PT Treatments $Gait Training: 8-22 mins   PT G Codes:       Etta Grandchild, PT, DPT Acute Rehab Services Pager: [phone]  Etta GrandchildSean  Serafina Topham 01/20/2017, 2:39 PM

## 2017-01-20 NOTE — Progress Notes (Signed)
  Echocardiogram 2D Echocardiogram has been performed.  Roosvelt MaserLane, Yuchen Fedor F 01/20/2017, 12:54 PM

## 2017-01-20 NOTE — Progress Notes (Signed)
PROGRESS NOTE    Victor NyJetty Karney  GNF:[id number] DOB: May 24, 1940 DOA: 01/19/2017 PCP: System, Pcp Not In   Brief Narrative:  Victor Mcclain is a 77 y.o. male with medical history significant of previously treated HTN (no longer on medications), Hx of BPH s/p what seems like a TURP, Hx of Renal Failure a few years ago requiring Hospitalization at [facility], and other comorbids who presented to [facility] with a cc of Unsteady Gait/Ataxia, Falling to the left side, with left sided weakness and Left facial droop. Last known well time was 9:00 pm last night. Patient woke up at 9:00 am this AM and wife stated that he was unsteady on his feet and was falling to the left side. She also noticed patient to have a facial droop. She also states he was not coordinated and because of concern patient was brought to the ED for evaluation. Patient's left facial droop has improved per wife but still remains slightly weak. [facility] was called to evaluate for Stroke Like Symptoms and admit for TIA vs. CVA. Neurology was called by EDP and evaluated patient in ED. 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 Neurology evaluated and recommending TEE/Loop Recorder to be done because they feel it was embolic in nature. TEE/Loop Recorder to be done Monday 01/22/17.   Assessment & Plan:   Active Problems:   Bradycardia   History of hypertension   History of BPH   Former smoker   Stroke (cerebrum) (HCC)   Hyperlipidemia  Acute Neurological Symptoms of Left Sided weakness and Facial Droop, Unsteady Gait 2/2 to  -Placed in Tele Observation but changed to Inpatient  -TIA/CVA Pathway -Code Stroke Head CT Negative for Intracranial Abnormalities -CTA Head and Neck showed Mild atherosclerotic disease the carotid bifurcation bilaterally without stenosis. No significant vertebral artery stenosis. 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 Severe  stenosis right posterior cerebral artery. Mild stenosis proximal left posterior cerebral artery. Anterior and middle cerebral arteries widely patent. 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 Bilateral posterior cerebral artery stenosis, severe in the proximal right P2 segment and moderate in the mid left P1 segment. -ECHO Done and Pending Read  -Neurology consulted by EDP; Appreciate Recc's; Neurology feels like it was an embolic stroke so are recommending TEE/Loop Recorder to be placed on Monday and have reached out to Cardiology  -HbA1c was 5.4 -Patient's Lipid Panel showed Cholesterol of 167, HDL of 37, LDL of 107, TG of 115, VLDL of 23 -Started Atorvastatin 40 mg po qHS -C/w ASA 325 mg po Daily for now  -Neurochecks per Protocol -NPO Until Stroke Swallow Screen and SLP; Passed bedside Stroke Screen -PT/OT/SLP Consultation; PT/OT recommending Outpatient PT/OT and recommending Rolling Walker with 5" Wheels and 3in1 Bedside Commode  -Allow for permissive HTN -Checked UDS and was Negative  Bradycardia -Asymptomatic  -Continue to Monitor on Telemetry  -Patient to have TEE/Loop Likely Monday  Hyperlipidemia -Patient's Lipid Panel showed Cholesterol of 167, HDL of 37, LDL of 107, TG of 115, VLDL of 23 -Started Atorvastatin 40 mg po qHS  Hx of HTN -No Longer on Antihypertensives -Allow for Permissive HTN given acute CVA -BP was 130/75 this AM  Hx of BPH -s/p TURP -Continue to Monitor  Prior Smoker -Not an Active Issue  Obesity -Diet  and Exercise recommended  DVT prophylaxis: Enoxaparin Code Status: FULL CODE Family Communication: Discussed with sister at bedside  Disposition Plan: Outpatient PT/OT with Rolling Walker with 5" wheels and 3 in 1 when fully worked up and medically stable to D/C    Consultants:  Neurology Stroke Team   Procedures:  ECHOCARDIOGRAM   Antimicrobials:  Anti-infectives (From admission, onward)   None     Subjective: Seen and examined at bedside and was tired. Wanting to sleep. No CP or SOB.   Objective: Vitals:   01/20/17 0200 01/20/17 0513 01/20/17 0954 01/20/17 1327  BP: (!) 132/94 139/87 139/76 130/75  Pulse: (!) 53 (!) 50 (!) 52 62  Resp:  20 16 16   Temp:  (!) 97.5 F (36.4 C) (!) 97.5 F (36.4 C) 97.8 F (36.6 C)  TempSrc:  Axillary Oral Oral  SpO2: 96% 97% 94% 94%  Weight:      Height:        Intake/Output Summary (Last 24 hours) at 01/20/2017 1537 Last data filed at 01/20/2017 1301 Gross per 24 hour  Intake 480 ml  Output -  Net 480 ml   Filed Weights   01/19/17 1411 01/19/17 2000  Weight: 104.3 kg (230 lb) 104.3 kg (229 lb 15 oz)   Examination: Physical Exam:  Constitutional: WN/WD obese Caucasian male in NAD and appears calm  Eyes: Lids and conjunctivae normal, sclerae anicteric  ENMT: External Ears, Nose appear normal. Grossly normal hearing. Mucous membranes are moist. Has a small lower left facial droop.  Neck: Appears normal, supple, no cervical masses, normal ROM, no appreciable thyromegaly, no JVD Respiratory: Clear to auscultation bilaterally, no wheezing, rales, rhonchi or crackles. Normal respiratory effort and patient is not tachypenic. No accessory muscle use.  Cardiovascular: RRR and slightly bradycardic, no murmurs / rubs / gallops. S1 and S2 auscultated. No extremity edema.  Abdomen: Soft, non-tender, Distended due to body habitus. No masses palpated. No appreciable hepatosplenomegaly. Bowel sounds positive x4.  GU: Deferred. Musculoskeletal: No clubbing / cyanosis of digits/nails. No joint deformity upper and lower extremities. Diminished strength in Left upper Extremity.  Skin: No rashes, lesions, ulcers on a limited skin eval. No induration; Warm and dry.  Neurologic: CN 2-12 grossly intact except  facial droop. Has diminished sensation on left slightly. Romberg sign cerebellar reflexes not assessed.  Psychiatric: Normal judgment and insight. Alert and oriented x 3. Normal mood and appropriate affect.   Data Reviewed: I have personally reviewed following labs and imaging studies  CBC: Recent Labs  Lab 01/19/17 1435 01/19/17 1448 01/20/17 0411  WBC 7.8  --  7.8  NEUTROABS 5.3  --  5.1  HGB 14.5 14.6 14.7  HCT 43.3 43.0 42.4  MCV 97.3  --  97.0  PLT 261  --  256   Basic Metabolic Panel: Recent Labs  Lab 01/19/17 1435 01/19/17 1448 01/20/17 0411  NA 140 142 138  K 3.8 4.1 3.5  CL 105 100* 104  CO2 27  --  24  GLUCOSE 96 92 101*  BUN 12 19 15   CREATININE 1.19 1.20 1.13  CALCIUM 9.1  --  9.0  MG  --   --  2.2  PHOS  --   --  3.8   GFR: Estimated Creatinine Clearance: 72.8 mL/min (by C-G formula based on SCr of 1.13 mg/dL). Liver Function Tests: Recent Labs  Lab 01/19/17 1435 01/20/17 0411  AST 20 18  ALT 17 15*  ALKPHOS 44 44  BILITOT 0.9 1.0  PROT 6.9 6.5  ALBUMIN 3.9 3.7   No results for input(s): LIPASE, AMYLASE in the last 168 hours. No results for input(s): AMMONIA in the last 168 hours. Coagulation Profile: Recent Labs  Lab 01/19/17 1435  INR 1.00   Cardiac Enzymes: No results for input(s): CKTOTAL, CKMB, CKMBINDEX, TROPONINI in the last 168 hours. BNP (last 3 results) No results for input(s): PROBNP in the last 8760 hours. HbA1C: Recent Labs    01/20/17 0411  HGBA1C 5.4   CBG: No results for input(s): GLUCAP in the last 168 hours. Lipid Profile: Recent Labs    01/20/17 0411  CHOL 167  HDL 37*  LDLCALC 107*  TRIG 115  CHOLHDL 4.5   Thyroid Function Tests: No results for input(s): TSH, T4TOTAL, FREET4, T3FREE, THYROIDAB in the last 72 hours. Anemia Panel: No results for input(s): VITAMINB12, FOLATE, FERRITIN, TIBC, IRON, RETICCTPCT in the last 72 hours. Sepsis Labs: No results for input(s): PROCALCITON, LATICACIDVEN in the last  168 hours.  No results found for this or any previous visit (from the past 240 hour(s)).   Radiology Studies: Ct Angio Head W Or Wo Contrast  Result Date: 01/19/2017 CLINICAL DATA:  Stroke, ataxia EXAM: CT ANGIOGRAPHY HEAD AND NECK TECHNIQUE: Multidetector CT imaging of the head and neck was performed using the standard protocol during bolus administration of intravenous contrast. Multiplanar CT image reconstructions and MIPs were obtained to evaluate the vascular anatomy. Carotid stenosis measurements (when applicable) are obtained utilizing NASCET criteria, using the distal internal carotid diameter as the denominator. CONTRAST:  > 50 mL ISOVUE-370 IOPAMIDOL (ISOVUE-370) INJECTION 76% COMPARISON:  CT head 01/19/2017 FINDINGS: CTA NECK FINDINGS Aortic arch: Mild atherosclerotic disease aortic arch without aneurysm or dissection. Proximal great vessels widely patent. Right carotid system: Mild atherosclerotic calcification proximal right internal carotid artery without significant stenosis. Left carotid system: Mild atherosclerotic calcification proximal left internal carotid artery without significant stenosis. Vertebral arteries: Right vertebral artery dominant. Both vertebral arteries are patent to the basilar without significant stenosis Skeleton: Cervical spondylosis without acute skeletal abnormality. Other neck: Negative for mass or adenopathy. Upper chest: Negative Review of the MIP images confirms the above findings CTA HEAD FINDINGS Anterior circulation: Cavernous carotid widely patent bilaterally. Anterior and middle cerebral arteries widely patent without stenosis Posterior circulation: Right vertebral artery dominant. Fusiform dilatation of the distal right vertebral artery proximal to the basilar. 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 PICA patent bilaterally. Basilar normal in caliber and widely patent. Severe stenosis mid right posterior cerebral artery. Mild stenosis left posterior  cerebral artery. Posterior communicating artery patent bilaterally. Venous sinuses: Patent Anatomic variants: None Delayed phase: Normal enhancement. Review of the MIP images confirms the above findings IMPRESSION: Mild atherosclerotic disease the carotid bifurcation bilaterally without stenosis. No significant vertebral artery stenosis. 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 Severe stenosis right posterior cerebral artery. Mild stenosis proximal left posterior cerebral artery. Anterior and middle cerebral arteries widely patent. Negative for emergent large vessel occlusion. Electronically Signed   By: Marlan Palau M.D.   On: 01/19/2017 18:37   Dg Chest 2 View  Result Date: 01/20/2017 CLINICAL DATA:  Shortness of breath.  MRI clearance. EXAM: CHEST  2 VIEW COMPARISON:  None. FINDINGS: The heart size and mediastinal contours are within normal limits. Both lungs are clear. The visualized skeletal structures are unremarkable. No metallic object. IMPRESSION: No active cardiopulmonary disease.  No metallic foreign body. Electronically Signed   By: Deatra Robinson M.D.   On: 01/20/2017  03:50   Ct Angio Neck W Or Wo Contrast  Result Date: 01/19/2017 CLINICAL DATA:  Stroke, ataxia EXAM: CT ANGIOGRAPHY HEAD AND NECK TECHNIQUE: Multidetector CT imaging of the head and neck was performed using the standard protocol during bolus administration of intravenous contrast. Multiplanar CT image reconstructions and MIPs were obtained to evaluate the vascular anatomy. Carotid stenosis measurements (when applicable) are obtained utilizing NASCET criteria, using the distal internal carotid diameter as the denominator. CONTRAST:  > 50 mL ISOVUE-370 IOPAMIDOL (ISOVUE-370) INJECTION 76% COMPARISON:  CT head 01/19/2017 FINDINGS: CTA NECK FINDINGS Aortic arch: Mild atherosclerotic disease aortic arch without aneurysm or dissection. Proximal great vessels widely patent. Right carotid system: Mild  atherosclerotic calcification proximal right internal carotid artery without significant stenosis. Left carotid system: Mild atherosclerotic calcification proximal left internal carotid artery without significant stenosis. Vertebral arteries: Right vertebral artery dominant. Both vertebral arteries are patent to the basilar without significant stenosis Skeleton: Cervical spondylosis without acute skeletal abnormality. Other neck: Negative for mass or adenopathy. Upper chest: Negative Review of the MIP images confirms the above findings CTA HEAD FINDINGS Anterior circulation: Cavernous carotid widely patent bilaterally. Anterior and middle cerebral arteries widely patent without stenosis Posterior circulation: Right vertebral artery dominant. Fusiform dilatation of the distal right vertebral artery proximal to the basilar. 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 PICA patent bilaterally. Basilar normal in caliber and widely patent. Severe stenosis mid right posterior cerebral artery. Mild stenosis left posterior cerebral artery. Posterior communicating artery patent bilaterally. Venous sinuses: Patent Anatomic variants: None Delayed phase: Normal enhancement. Review of the MIP images confirms the above findings IMPRESSION: Mild atherosclerotic disease the carotid bifurcation bilaterally without stenosis. No significant vertebral artery stenosis. 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 Severe stenosis right posterior cerebral artery. Mild stenosis proximal left posterior cerebral artery. Anterior and middle cerebral arteries widely patent. Negative for emergent large vessel occlusion. Electronically Signed   By: Marlan Palau M.D.   On: 01/19/2017 18:37   Mr Brain Wo Contrast  Result Date: 01/20/2017 CLINICAL DATA:  Unsteady gait, left facial droop and left-sided weakness EXAM: MRI HEAD WITHOUT CONTRAST MRA HEAD WITHOUT CONTRAST TECHNIQUE: Multiplanar, multiecho pulse  sequences of the brain and surrounding structures were obtained without intravenous contrast. Angiographic images of the head were obtained using MRA technique without contrast. COMPARISON:  CTA head neck 01/19/2017 FINDINGS: MRI HEAD FINDINGS Brain: The midline structures are normal. There is focal diffusion restriction within the anteromedial right thalamus, measuring approximately 1.6 cm. There is mild periventricular leukoaraiosis, most commonly seen in the setting of chronic ischemic microangiopathy. No mass lesion. No chronic microhemorrhage or cerebral amyloid angiopathy. No hydrocephalus, age advanced atrophy or lobar predominant volume loss. No dural abnormality or extra-axial collection. Skull and upper cervical spine: The visualized skull base, calvarium, upper cervical spine and extracranial soft tissues are normal. Sinuses/Orbits: No fluid levels or advanced mucosal thickening. No mastoid effusion. Normal orbits. MRA HEAD FINDINGS Intracranial internal carotid arteries: Normal. Anterior cerebral arteries: Normal. Middle cerebral arteries: Normal. Posterior communicating arteries: Present bilaterally. Posterior cerebral arteries: Bilateral fetal origins. Severe stenosis of the proximal right P2 segment. Moderate stenosis of the left P1 segment. Basilar artery: Normal. Vertebral arteries: Right-dominant. Fusiform dilatation of the right vertebral artery. Superior cerebellar arteries: Normal. Anterior inferior cerebellar arteries: Not clearly visualized, but this is not uncommon. Posterior inferior cerebellar arteries: Normal. IMPRESSION: 1. Small acute ischemic infarct of the anteromedial right thalamus without hemorrhage or mass effect. 2. Bilateral posterior cerebral artery stenosis,  severe in the proximal right P2 segment and moderate in the mid left P1 segment. Electronically Signed   By: Deatra Robinson M.D.   On: 01/20/2017 03:44   Mr Maxine Glenn Head Wo Contrast  Result Date: 01/20/2017 CLINICAL DATA:   Unsteady gait, left facial droop and left-sided weakness EXAM: MRI HEAD WITHOUT CONTRAST MRA HEAD WITHOUT CONTRAST TECHNIQUE: Multiplanar, multiecho pulse sequences of the brain and surrounding structures were obtained without intravenous contrast. Angiographic images of the head were obtained using MRA technique without contrast. COMPARISON:  CTA head neck 01/19/2017 FINDINGS: MRI HEAD FINDINGS Brain: The midline structures are normal. There is focal diffusion restriction within the anteromedial right thalamus, measuring approximately 1.6 cm. There is mild periventricular leukoaraiosis, most commonly seen in the setting of chronic ischemic microangiopathy. No mass lesion. No chronic microhemorrhage or cerebral amyloid angiopathy. No hydrocephalus, age advanced atrophy or lobar predominant volume loss. No dural abnormality or extra-axial collection. Skull and upper cervical spine: The visualized skull base, calvarium, upper cervical spine and extracranial soft tissues are normal. Sinuses/Orbits: No fluid levels or advanced mucosal thickening. No mastoid effusion. Normal orbits. MRA HEAD FINDINGS Intracranial internal carotid arteries: Normal. Anterior cerebral arteries: Normal. Middle cerebral arteries: Normal. Posterior communicating arteries: Present bilaterally. Posterior cerebral arteries: Bilateral fetal origins. Severe stenosis of the proximal right P2 segment. Moderate stenosis of the left P1 segment. Basilar artery: Normal. Vertebral arteries: Right-dominant. Fusiform dilatation of the right vertebral artery. Superior cerebellar arteries: Normal. Anterior inferior cerebellar arteries: Not clearly visualized, but this is not uncommon. Posterior inferior cerebellar arteries: Normal. IMPRESSION: 1. Small acute ischemic infarct of the anteromedial right thalamus without hemorrhage or mass effect. 2. Bilateral posterior cerebral artery stenosis, severe in the proximal right P2 segment and moderate in the mid left  P1 segment. Electronically Signed   By: Deatra Robinson M.D.   On: 01/20/2017 03:44   Ct Head Code Stroke Wo Contrast  Addendum Date: 01/19/2017   ADDENDUM REPORT: 01/19/2017 15:46 ADDENDUM: These results were called by telephone at the time of interpretation on 01/19/2017 at 3:46 pm to Dr. Otelia Limes, who verbally acknowledged these results. Electronically Signed   By: Marlan Palau M.D.   On: 01/19/2017 15:46   Result Date: 01/19/2017 CLINICAL DATA:  Code stroke. Focal neuro deficit greater than 6 hours. Left-sided weakness and facial droop EXAM: CT HEAD WITHOUT CONTRAST TECHNIQUE: Contiguous axial images were obtained from the base of the skull through the vertex without intravenous contrast. COMPARISON:  None. FINDINGS: Brain: Ventricle size and cerebral volume normal for age. Negative for acute infarct, hemorrhage, or mass. No fluid collection or shift of the midline structures. Vascular: Negative for hyperdense vessel Skull: Negative Sinuses/Orbits: Mild mucosal edema paranasal sinuses. Right cataract removal. No orbital mass. Other: None ASPECTS (Alberta Stroke Program Early CT Score) - Ganglionic level infarction (caudate, lentiform nuclei, internal capsule, insula, M1-M3 cortex): 7 - Supraganglionic infarction (M4-M6 cortex): 3 Total score (0-10 with 10 being normal): 10 IMPRESSION: 1. No acute abnormality 2. ASPECTS is 10 3. I paged the stroke neurologist at this time. Electronically Signed: By: Marlan Palau M.D. On: 01/19/2017 14:59   Scheduled Meds: . aspirin  300 mg Rectal Daily   Or  . aspirin  325 mg Oral Daily  . atorvastatin  40 mg Oral q1800  . enoxaparin (LOVENOX) injection  40 mg Subcutaneous Q24H   Continuous Infusions: . sodium chloride 50 mL/hr at 01/19/17 2019    LOS: 0 days   Merlene Laughter, DO [facility]  Pager [phone]  If 7PM-7AM, please contact night-coverage [email or web address] Password [facility] 01/20/2017, 3:37 PM

## 2017-01-20 NOTE — Progress Notes (Signed)
STROKE TEAM PROGRESS NOTE   HISTORY OF PRESENT ILLNESS (per record) Kolbey Teichert is an 77 y.o. male presenting via EMS from home with new onset of dysarthria, left facial droop and left arm weakness. LKN was 9:00 PM on Thursday. He also endorsed leaning towards the left with ambulation and speech "slightly off on some words"since this morning. His wife noticed that the patient was not himself this morning. He states that he is not on ASA or Plavix. He is not on a blood thinner.   LSN: 9:00 PM on Thursday tPA Given: No: Out of time window    SUBJECTIVE (INTERVAL HISTORY) No family members present. The patient reports feeling somewhat better.    OBJECTIVE Temp:  [97.5 F (36.4 C)-98.3 F (36.8 C)] 97.8 F (36.6 C) (01/19 1327) Pulse Rate:  [47-62] 62 (01/19 1327) Cardiac Rhythm: Sinus bradycardia;Bundle branch block (01/19 0701) Resp:  [16-20] 16 (01/19 1327) BP: (130-159)/(75-94) 130/75 (01/19 1327) SpO2:  [94 %-98 %] 94 % (01/19 1327) Weight:  [229 lb 15 oz (104.3 kg)] 229 lb 15 oz (104.3 kg) (01/18 2000)  CBC:  Recent Labs  Lab 01/19/17 1435 01/19/17 1448 01/20/17 0411  WBC 7.8  --  7.8  NEUTROABS 5.3  --  5.1  HGB 14.5 14.6 14.7  HCT 43.3 43.0 42.4  MCV 97.3  --  97.0  PLT 261  --  256    Basic Metabolic Panel:  Recent Labs  Lab 01/19/17 1435 01/19/17 1448 01/20/17 0411  NA 140 142 138  K 3.8 4.1 3.5  CL 105 100* 104  CO2 27  --  24  GLUCOSE 96 92 101*  BUN 12 19 15   CREATININE 1.19 1.20 1.13  CALCIUM 9.1  --  9.0  MG  --   --  2.2  PHOS  --   --  3.8    Lipid Panel:     Component Value Date/Time   CHOL 167 01/20/2017 0411   TRIG 115 01/20/2017 0411   HDL 37 (L) 01/20/2017 0411   CHOLHDL 4.5 01/20/2017 0411   VLDL 23 01/20/2017 0411   LDLCALC 107 (H) 01/20/2017 0411   HgbA1c:  Lab Results  Component Value Date   HGBA1C 5.4 01/20/2017   Urine Drug Screen:     Component Value Date/Time   LABOPIA NONE DETECTED 01/19/2017 1513   COCAINSCRNUR NONE DETECTED 01/19/2017 1513   LABBENZ NONE DETECTED 01/19/2017 1513   AMPHETMU NONE DETECTED 01/19/2017 1513   THCU NONE DETECTED 01/19/2017 1513   LABBARB NONE DETECTED 01/19/2017 1513    Alcohol Level     Component Value Date/Time   ETH <10 01/19/2017 1435    IMAGING   Ct Angio Head W Or Wo Contrast Ct Angio Neck W Or Wo Contrast 01/19/2017 IMPRESSION:  Mild atherosclerotic disease the carotid bifurcation bilaterally without stenosis.  No significant vertebral artery stenosis.  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  Severe stenosis right posterior cerebral artery.  Mild stenosis proximal left posterior cerebral artery.  Anterior and middle cerebral arteries widely patent.  Negative for emergent large vessel occlusion.    Dg Chest 2 View 01/20/2017 IMPRESSION:  No active cardiopulmonary disease.  No metallic foreign body.    Mr Maxine Glenn Head Wo Contrast 01/20/2017 IMPRESSION:  1. Small acute ischemic infarct of the anteromedial right thalamus without hemorrhage or mass effect.  2. Bilateral posterior cerebral artery stenosis, severe in the proximal right P2 segment and moderate in the mid left P1 segment.  Ct Head Code Stroke Wo Contrast 01/19/2017 IMPRESSION:  1. No acute abnormality  2. ASPECTS is 10 3. I paged the stroke neurologist at this time.     Transthoracic Echocardiogram - pending 00/00/00    Bilateral Carotid Dopplers  01/20/2017 Final Interpretation: Right Carotid: Velocities in the right ICA are consistent with a 1-39% stenosis. Left Carotid: Velocities in the left ICA are consistent with a 1-39% stenosis. Vertebrals: Both vertebral arteries were patent with antegrade flow.     PHYSICAL EXAM Vitals:   01/20/17 0200 01/20/17 0513 01/20/17 0954 01/20/17 1327  BP: (!) 132/94 139/87 139/76 130/75  Pulse: (!) 53 (!) 50 (!) 52 62  Resp:  20 16 16   Temp:  (!) 97.5 F (36.4 C) (!) 97.5 F (36.4 C)  97.8 F (36.6 C)  TempSrc:  Axillary Oral Oral  SpO2: 96% 97% 94% 94%  Weight:      Height:      Neurological Exam :  Awake alert oriented x 3 normal speech and language. Mild left lower face asymmetry. Tongue midline. No drift. Mild diminished fine finger movements on left. Orbits right over left upper extremity. Mild left grip weak.. Normal sensation . Normal coordination.    ASSESSMENT/PLAN Mr. Kevan NyJetty Vawter is a 77 y.o. male with no significant past medical history presenting with dysarthria and left-sided weakness. He did not receive IV t-PA due to late presentation.  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 Yes; Fluid consistency: Thin  No antithrombotic prior to admission, now on aspirin 325 mg daily  Patient counseled to be compliant with his antithrombotic medications  Ongoing aggressive stroke risk factor management  Therapy recommendations:  Outpatient PT and OT recommended.  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   Severe stenosis right posterior cerebral artery.    Plan / Recommendations   Await echocardiogram results  Outpatient PT and OT  Follow-up with Dr. Pearlean BrownieSethi in 6 weeks  Continue aspirin and statin at discharge  Schedule TEE and loop for Monday - message sent to cardiology.  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 Agree with note above.  He has presented with an embolic stroke and so . Her mind. Recommend check TEE and loop recorder on Monday unless transthoracic echo and telemetry unit and the cause of stroke. Continue ongoing stroke workup. Greater than 50% time during this study 5 minute visit was spent in counseling and coordination of care about his embolic stroke and discussing further workup and answering questions  Delia Heady, MD Medical Director [facility] Pager: [phone] 01/20/2017 3:18 PM   To contact Stroke Continuity provider, please refer to [email or web address]. After hours, contact General Neurology

## 2017-01-20 NOTE — Progress Notes (Signed)
CTA neck performed 1/18- please advise if carotid duplex still needed. [phone] Vascular lab

## 2017-01-20 NOTE — Progress Notes (Signed)
Carotid duplex prelim: 1-39% ICA stenosis.  Victor Mcclain, RDMS, RVT   

## 2017-01-20 NOTE — Evaluation (Signed)
Occupational Therapy Evaluation Patient Details Name: Victor Mcclain MRN: [id number] DOB: [birth year] Today's Date: 01/20/2017    History of Present Illness 77 y.o. male with medical history significant of previously treated HTN (no longer on medications), Hx of BPH s/p what seems like a TURP, Hx of Renal Failure a few years ago requiring Hospitalization at [facility], and other comorbids who [facility] with a cc of Unsteady Gait/Ataxia, Falling to the left side, with left sided weakness and Left facial droop. 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   Clinical Impression   PTA, pt was living with his wife and was independent and working full time. Pt currently requiring Min Guard for UB ADLs and Mod A for ADLs in standing, LB ADLs, and functional mobility. Pt presenting with poor FM skills, coordination, safety awareness, and balance. Pt would benefit from further acute OT to facilitate safe dc. Recommend dc home with 24 hour supervision for safety and follow up at neuro OP OT to increase safety and independence with ADLs, IADLs, and functional mobility.     Follow Up Recommendations  Outpatient OT;Supervision/Assistance - 24 hour(Neuro OP OT)    Equipment Recommendations  3 in 1 bedside commode    Recommendations for Other Services PT consult     Precautions / Restrictions Precautions Precautions: Fall Restrictions Weight Bearing Restrictions: No      Mobility Bed Mobility Overal bed mobility: Needs Assistance Bed Mobility: Supine to Sit     Supine to sit: Supervision;HOB elevated     General bed mobility comments: Supervision for safety. Required increased time  Transfers Overall transfer level: Needs assistance Equipment used: None Transfers: Sit to/from Stand Sit to Stand: Mod assist         General transfer comment: Mod A for correction of LOB and to steady in  standing    Balance                                           ADL either performed or assessed with clinical judgement   ADL Overall ADL's : Needs assistance/impaired Eating/Feeding: Set up;Sitting   Grooming: Oral care;Wash/dry face;Standing;Moderate assistance Grooming Details (indicate cue type and reason): Min A (with occasional Mod A) for standing balance with noted L lateral lean. Pt presenting with poor FM skills as seen during opening mouth wash and dropping in sink. Pt with decreased funcitonal incorporation and crossign of midline of L hand into tasks. Poor awarness of L hand during grooming tasks. Upper Body Bathing: Min guard;Sitting   Lower Body Bathing: Moderate assistance;Sit to/from stand   Upper Body Dressing : Min guard;Sitting Upper Body Dressing Details (indicate cue type and reason): Donned new gown like jacket Lower Body Dressing: Moderate assistance;Sit to/from stand Lower Body Dressing Details (indicate cue type and reason): Adjusted socks at EOB. Mod A for standing balance with dynamic tasks.  Toilet Transfer: Moderate assistance;Ambulation(Simulated in room)   Toileting- Clothing Manipulation and Hygiene: Minimal assistance Toileting - Clothing Manipulation Details (indicate cue type and reason): Min A for standing balance for toilet hygiene after urination at toilet     Functional mobility during ADLs: Moderate assistance General ADL Comments: Pt presenting with decreased functional performance. Presenting with poor balance and decreased safety awarness and awarness of deficits. Poor coordination and FM skills noted during grooming tasks. Pt  running into objects and doorway on L side. Discussed dc options and safety     Vision Baseline Vision/History: Wears glasses Wears Glasses: At all times Patient Visual Report: No change from baseline Vision Assessment?: Yes;Vision impaired- to be further tested in functional context Eye Alignment:  Within Functional Limits Ocular Range of Motion: Within Functional Limits Alignment/Gaze Preference: Within Defined Limits Tracking/Visual Pursuits: Decreased smoothness of horizontal tracking;Decreased smoothness of vertical tracking Depth Perception: Overshoots Additional Comments: Pt with decreased smooth tracking throughout ocular ROM. Overshooting to the left on finger to nose test and during fucntional tasks.  Denies any blurry or double vision     Perception     Praxis      Pertinent Vitals/Pain Pain Assessment: No/denies pain     Hand Dominance Right   Extremity/Trunk Assessment Upper Extremity Assessment Upper Extremity Assessment: RUE deficits/detail;LUE deficits/detail RUE Deficits / Details: R handed. WFL strength. Poor coordination as seen during funcitonal tasks, finger opposition, and finger to nose testing. L worse than R RUE Coordination: decreased fine motor LUE Deficits / Details: WFL strength. Decreased FM skills. Poor coordination as seen during funcitonal tasks, finger opposition, and finger to nose testing. During finger opposition, pt requiring increased time and effort to isolate movements.  LUE Coordination: decreased fine motor   Lower Extremity Assessment Lower Extremity Assessment: Defer to PT evaluation   Cervical / Trunk Assessment Cervical / Trunk Assessment: Normal;Other exceptions Cervical / Trunk Exceptions: Noted left lateral lean and forward flexion in sitting   Communication Communication Communication: HOH   Cognition Arousal/Alertness: Awake/alert Behavior During Therapy: WFL for tasks assessed/performed Overall Cognitive Status: Impaired/Different from baseline Area of Impairment: Following commands;Safety/judgement;Problem solving;Awareness                       Following Commands: Follows multi-step commands with increased time;Follows one step commands with increased time Safety/Judgement: Decreased awareness of  safety;Decreased awareness of deficits(With cueing, pt able to recognize L lateral lean) Awareness: Emergent Problem Solving: Slow processing;Requires verbal cues General Comments: Pt required increased time throughout session for slower processing as seen during functional tasks and answering PLOF questions.    General Comments  Son and granddaughter arriving at end of sessiion    Exercises     Shoulder Instructions      Home Living Family/patient expects to be discharged to:: Private residence Living Arrangements: Spouse/significant other Available Help at Discharge: Family;Available 24 hours/day Type of Home: House Home Access: Stairs to enter [employer] of Steps: 9 Entrance Stairs-Rails: Right;Left Home Layout: One level     Bathroom Shower/Tub: Producer, television/film/video: Standard     Home Equipment: Shower seat - built in          Prior Functioning/Environment Level of Independence: Independent        Comments: ADLs, IADLs, driving, and working full time and owner of car body shop        OT Problem List: Decreased range of motion;Decreased activity tolerance;Impaired balance (sitting and/or standing);Decreased coordination;Decreased cognition;Decreased safety awareness;Decreased knowledge of use of DME or AE;Decreased knowledge of precautions      OT Treatment/Interventions: Self-care/ADL training;Therapeutic exercise;Energy conservation;DME and/or AE instruction;Therapeutic activities;Patient/family education    OT Goals(Current goals can be found in the care plan section) Acute Rehab OT Goals Patient Stated Goal: Go home soon OT Goal Formulation: With patient Time For Goal Achievement: 02/03/17 Potential to Achieve Goals: Good ADL Goals Pt Will Perform Grooming: standing;with modified independence Pt Will Perform  Lower Body Dressing: with set-up;with supervision;sit to/from stand Pt Will Perform Tub/Shower Transfer: Shower transfer;3  in 1;with supervision;ambulating Additional ADL Goal #1: Pt will verbalize three fall prevention strategies with 1-2 VCs  OT Frequency: Min 2X/week   Barriers to D/C:            Co-evaluation              AM-PAC PT "6 Clicks" Daily Activity     Outcome Measure Help from another person eating meals?: None Help from another person taking care of personal grooming?: A Little Help from another person toileting, which includes using toliet, bedpan, or urinal?: A Little Help from another person bathing (including washing, rinsing, drying)?: A Lot Help from another person to put on and taking off regular upper body clothing?: A Little Help from another person to put on and taking off regular lower body clothing?: A Lot 6 Click Score: 17   End of Session Equipment Utilized During Treatment: Gait belt Nurse Communication: Mobility status  Activity Tolerance: Patient tolerated treatment well Patient left: in bed;with call bell/phone within reach;with bed alarm set;with family/visitor present(Sitting EOB with alarm)  OT Visit Diagnosis: Unsteadiness on feet (R26.81);Other abnormalities of gait and mobility (R26.89);Other symptoms and signs involving cognitive function;Ataxia, unspecified (R27.0);History of falling (Z91.81)                Time: 4098-1191 OT Time Calculation (min): 33 min Charges:  OT General Charges $OT Visit: 1 Visit OT Evaluation $OT Eval Moderate Complexity: 1 Mod OT Treatments $Self Care/Home Management : 8-22 mins G-Codes:     Kymberley Raz MSOT, OTR/L Acute Rehab Pager: [phone] Office: [phone]  Theodoro Grist Terril Chestnut 01/20/2017, 10:16 AM

## 2017-01-21 DIAGNOSIS — I6300 Cerebral infarction due to thrombosis of unspecified precerebral artery: Secondary | ICD-10-CM

## 2017-01-21 DIAGNOSIS — N182 Chronic kidney disease, stage 2 (mild): Secondary | ICD-10-CM

## 2017-01-21 LAB — CBC WITH DIFFERENTIAL/PLATELET
Basophils Absolute: 0 10*3/uL (ref 0.0–0.1)
Basophils Relative: 1 %
EOS PCT: 5 %
Eosinophils Absolute: 0.4 10*3/uL (ref 0.0–0.7)
HCT: 44.1 % (ref 39.0–52.0)
Hemoglobin: 14.7 g/dL (ref 13.0–17.0)
LYMPHS ABS: 2 10*3/uL (ref 0.7–4.0)
LYMPHS PCT: 24 %
MCH: 32.6 pg (ref 26.0–34.0)
MCHC: 33.3 g/dL (ref 30.0–36.0)
MCV: 97.8 fL (ref 78.0–100.0)
MONOS PCT: 8 %
Monocytes Absolute: 0.7 10*3/uL (ref 0.1–1.0)
Neutro Abs: 5.2 10*3/uL (ref 1.7–7.7)
Neutrophils Relative %: 62 %
PLATELETS: 245 10*3/uL (ref 150–400)
RBC: 4.51 MIL/uL (ref 4.22–5.81)
RDW: 12.4 % (ref 11.5–15.5)
WBC: 8.3 10*3/uL (ref 4.0–10.5)

## 2017-01-21 LAB — COMPREHENSIVE METABOLIC PANEL
ALT: 14 U/L — AB (ref 17–63)
AST: 18 U/L (ref 15–41)
Albumin: 3.7 g/dL (ref 3.5–5.0)
Alkaline Phosphatase: 46 U/L (ref 38–126)
Anion gap: 9 (ref 5–15)
BUN: 18 mg/dL (ref 6–20)
CHLORIDE: 104 mmol/L (ref 101–111)
CO2: 27 mmol/L (ref 22–32)
CREATININE: 1.34 mg/dL — AB (ref 0.61–1.24)
Calcium: 8.8 mg/dL — ABNORMAL LOW (ref 8.9–10.3)
GFR, EST AFRICAN AMERICAN: 59 mL/min — AB (ref >60)
GFR, EST NON AFRICAN AMERICAN: 51 mL/min — AB (ref >60)
Glucose, Bld: 109 mg/dL — ABNORMAL HIGH (ref 65–99)
Potassium: 3.8 mmol/L (ref 3.5–5.1)
Sodium: 140 mmol/L (ref 135–145)
Total Bilirubin: 0.8 mg/dL (ref 0.3–1.2)
Total Protein: 6.5 g/dL (ref 6.5–8.1)

## 2017-01-21 LAB — MAGNESIUM: MAGNESIUM: 2.2 mg/dL (ref 1.7–2.4)

## 2017-01-21 LAB — PHOSPHORUS: PHOSPHORUS: 3.2 mg/dL (ref 2.5–4.6)

## 2017-01-21 MED ORDER — HEPARIN SODIUM (PORCINE) 5000 UNIT/ML IJ SOLN
5000.0000 [IU] | Freq: Three times a day (TID) | INTRAMUSCULAR | Status: DC
  Administered 2017-01-21 – 2017-01-22 (×3): 5000 [IU] via SUBCUTANEOUS
  Filled 2017-01-21 (×3): qty 1

## 2017-01-21 NOTE — Progress Notes (Signed)
STROKE TEAM PROGRESS NOTE   HISTORY OF PRESENT ILLNESS (per record) Victor Mcclain is an 77 y.o. male presenting via EMS from home with new onset of dysarthria, left facial droop and left arm weakness. LKN was 9:00 PM on Thursday. He also endorsed leaning towards the left with ambulation and speech "slightly off on some words"since this morning. His wife noticed that the patient was not himself this morning. He states that he is not on ASA or Plavix. He is not on a blood thinner.   LSN: 9:00 PM on Thursday tPA Given: No: Out of time window    SUBJECTIVE (INTERVAL HISTORY) No family members present. The patient reports feeling somewhat better.Echo and dopplers were fine. Await TEE and loop tomorrow. I spoke to his wife over the phone and updated her    OBJECTIVE Temp:  [97.7 F (36.5 C)-98.1 F (36.7 C)] 98.1 F (36.7 C) (01/20 1028) Pulse Rate:  [56-66] 56 (01/20 1028) Cardiac Rhythm: Ventricular tachycardia (01/20 0052) Resp:  [16-20] 19 (01/20 1028) BP: (127-146)/(71-90) 146/77 (01/20 1028) SpO2:  [93 %-98 %] 93 % (01/20 1028)  CBC:  Recent Labs  Lab 01/20/17 0411 01/21/17 0616  WBC 7.8 8.3  NEUTROABS 5.1 5.2  HGB 14.7 14.7  HCT 42.4 44.1  MCV 97.0 97.8  PLT 256 245    Basic Metabolic Panel:  Recent Labs  Lab 01/20/17 0411 01/21/17 0616  NA 138 140  K 3.5 3.8  CL 104 104  CO2 24 27  GLUCOSE 101* 109*  BUN 15 18  CREATININE 1.13 1.34*  CALCIUM 9.0 8.8*  MG 2.2 2.2  PHOS 3.8 3.2    Lipid Panel:     Component Value Date/Time   CHOL 167 01/20/2017 0411   TRIG 115 01/20/2017 0411   HDL 37 (L) 01/20/2017 0411   CHOLHDL 4.5 01/20/2017 0411   VLDL 23 01/20/2017 0411   LDLCALC 107 (H) 01/20/2017 0411   HgbA1c:  Lab Results  Component Value Date   HGBA1C 5.4 01/20/2017   Urine Drug Screen:     Component Value Date/Time   LABOPIA NONE DETECTED 01/19/2017 1513   COCAINSCRNUR NONE DETECTED 01/19/2017 1513   LABBENZ NONE DETECTED 01/19/2017 1513   AMPHETMU NONE DETECTED 01/19/2017 1513   THCU NONE DETECTED 01/19/2017 1513   LABBARB NONE DETECTED 01/19/2017 1513    Alcohol Level     Component Value Date/Time   ETH <10 01/19/2017 1435    IMAGING   Ct Angio Head W Or Wo Contrast Ct Angio Neck W Or Wo Contrast 01/19/2017 IMPRESSION:  Mild atherosclerotic disease the carotid bifurcation bilaterally without stenosis.  No significant vertebral artery stenosis.  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  Severe stenosis right posterior cerebral artery.  Mild stenosis proximal left posterior cerebral artery.  Anterior and middle cerebral arteries widely patent.  Negative for emergent large vessel occlusion.    Dg Chest 2 View 01/20/2017 IMPRESSION:  No active cardiopulmonary disease.  No metallic foreign body.    Mr Victor Mcclain Head Wo Contrast 01/20/2017 IMPRESSION:  1. Small acute ischemic infarct of the anteromedial right thalamus without hemorrhage or mass effect.  2. Bilateral posterior cerebral artery stenosis, severe in the proximal right P2 segment and moderate in the mid left P1 segment.    Ct Head Code Stroke Wo Contrast 01/19/2017 IMPRESSION:  1. No acute abnormality  2. ASPECTS is 10 3. I paged the stroke neurologist at this time.     Transthoracic Echocardiogram -  Left ventricle: The cavity size was normal. Wall thickness was   increased in a pattern of mild LVH. Systolic function was normal.   The estimated ejection fraction was in the range of 60% to 65%.   Wall motion was normal; there were no regional wall motion   abnormalities   Bilateral Carotid Dopplers  01/20/2017 Final Interpretation: Right Carotid: Velocities in the right ICA are consistent with a 1-39% stenosis. Left Carotid: Velocities in the left ICA are consistent with a 1-39% stenosis. Vertebrals: Both vertebral arteries were patent with antegrade flow.     PHYSICAL EXAM Vitals:   01/20/17 2200 01/21/17 0145  01/21/17 0528 01/21/17 1028  BP: (!) 145/71 136/75 (!) 142/90 (!) 146/77  Pulse: 64 66 (!) 57 (!) 56  Resp: 18 20 18 19   Temp: 98.1 F (36.7 C) 98 F (36.7 C) 97.7 F (36.5 C) 98.1 F (36.7 C)  TempSrc: Oral Oral Oral Oral  SpO2: 93% 95% 98% 93%  Weight:      Height:      Neurological Exam :  Awake alert oriented x 3 normal speech and language. Mild left lower face asymmetry. Tongue midline. No drift. Mild diminished fine finger movements on left. Orbits right over left upper extremity. Mild left grip weak.. Normal sensation . Normal coordination.    ASSESSMENT/PLAN [name] is a 77 y.o. male with no significant past medical history presenting with dysarthria and left-sided weakness. He did not receive IV t-PA due to late presentation.  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 Wall thickness was   increased in a pattern of mild LVH. Systolic function was normal.   The estimated ejection fraction was in the range of 60% to 65%.   Wall motion was normal; there were no regional wall motion    abnormalities  LDL - 107  HgbA1c - 5.4  VTE prophylaxis - Lovenox Diet Heart Room service appropriate? Yes; Fluid consistency: Thin Diet NPO time specified Except for: Sips with Meds  No antithrombotic prior to admission, now on aspirin 325 mg daily  Patient counseled to be compliant with his antithrombotic medications  Ongoing aggressive stroke risk factor management  Therapy recommendations:  Outpatient PT and OT recommended.  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   Severe stenosis right posterior cerebral artery.    Plan / Recommendations   Await TEE and loop  Outpatient PT and OT  Follow-up with Dr. Pearlean Brownie in 6 weeks  Continue aspirin and statin at discharge  Schedule TEE and loop  for Monday - message sent to cardiology.  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  He has presented with an embolic stroke and so . Her mind. Recommend check TEE and loop recorder on Monday Continue ongoing stroke workup. Greater than 50% time during this study 25 minute visit was spent in counseling and coordination of care about his embolic stroke and discussing further workup and answering questions  Delia HeadyPramod Sethi, MD Medical Director [facility] Pager: [phone] 01/21/2017 11:27 AM   To contact Stroke Continuity provider, please refer to [email or web address]. After hours, contact General Neurology

## 2017-01-21 NOTE — Evaluation (Signed)
Speech Language Pathology Evaluation Patient Details Name: Victor Mcclain MRN: [id number] DOB: [birth year] Today's Date: 01/21/2017 Time: 1421-     Problem List:  Patient Active Problem List   Diagnosis Date Noted  . 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 Hyperlipidemia 01/20/2017  . Bradycardia 01/19/2017  . History of hypertension 01/19/2017  . History of BPH 01/19/2017  . Former smoker 01/19/2017   Past Medical History:  Past Medical History:  Diagnosis Date  . Cataract   . 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 Esophageal stretching     HPI:  Pt is a 77 y.o. male with PMH of previously treated HTN (no longer on medications), Hx of BPH s/p what seems like a TURP, Hx of Renal Failure a few years ago requiring Hospitalization at [facility], and other comorbids [facility]  with a cc of Unsteady Gait/Ataxia, Falling to the left side, with left sided weakness and Left facial droop. MRI showed Small acute ischemic infarct of the anteromedial right thalamus without hemorrhage or mass effect. Neuro noted dysarthria and "slightly off on some words". Cognitive linguistic eval ordered as part of stroke workup.   Assessment / Plan / Recommendation Clinical Impression  Pt presenting with mild cognitive deficits including decreased short term memory, decreased verbal and functional problem solving, and decreased safety awareness. Much of this may be pt's baseline; he stated "I didn't have a memory to begin with". Attempted completing the [facility] but pt became argumentative with every question so it was discontinued. He recalled 3 out of 5 words with 5-minute delay. 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 Language/ motor speech skills are within functional limits. Pt may benefit from home health speech therapy; however is resistant to recommendations and might not participate. Will leave this up to family if they would like to pursue. Will sign off for acute needs; please re-consult if needs arise.     SLP Assessment  SLP Recommendation/Assessment: All further Speech Lanaguage Pathology  needs can be addressed in the next venue of care SLP Visit Diagnosis: Cognitive communication deficit (R41.841)    Follow Up Recommendations  24 hour supervision/assistance;Other (comment)(Possible HH for cognition but not sure ofparticipation level)    Frequency and Duration           SLP Evaluation Cognition  Overall Cognitive Status: (Unclear if different from baseline) Arousal/Alertness: Awake/alert Orientation Level: Oriented to person;Oriented to place;Oriented to situation;Disoriented to time Attention: Selective Selective Attention: Appears intact Memory: Impaired Memory Impairment: Decreased short term memory Decreased Short Term Memory: Verbal basic Problem Solving: Impaired Problem Solving Impairment: Verbal basic;Functional basic Safety/Judgment: Impaired       Comprehension  Auditory Comprehension Overall Auditory Comprehension: Appears within functional limits for tasks assessed Yes/No Questions: Within Functional Limits Commands: Within Functional Limits Conversation: Complex    Expression Expression Primary Mode of Expression: Verbal Verbal Expression Overall Verbal Expression: Appears within functional limits for tasks assessed   Oral / Motor  Oral Motor/Sensory Function Overall Oral Motor/Sensory Function: Within functional limits Motor Speech Overall Motor Speech: Appears within functional limits for tasks assessed Intelligibility: Intelligible   GO  Metro Kung, MA, CCC-SLP 01/21/2017, 2:58 PM  [phone]

## 2017-01-21 NOTE — Progress Notes (Signed)
Physical Therapy Treatment Patient Details Name: Victor Mcclain MRN: [id number] DOB: July 23, 1940 Today's Date: 01/21/2017    History of Present Illness 77 y.o. male with medical history significant of previously treated HTN (no longer on medications), Hx of BPH s/p what seems like a TURP, Hx of Renal Failure a few years ago requiring Hospitalization at [facility], and other comorbids who presented to [facility] ED with a cc of Unsteady Gait/Ataxia, Falling to the left side, with left sided weakness and Left facial droop. 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    PT Comments    Pt demonstrates good tolerance for gait and standing activities. Pt does, however, continue to have safety awareness issues as noted with him going to the bathroom without having the IV removed and pt pulls off IV cap. Pt is able to maneuver around room and down hallways without an AD this session with decreased LOB and not drifting to the left this session. Below recommendation continued to be appropriate.     Follow Up Recommendations  Outpatient PT;Supervision/Assistance - 24 hour     Equipment Recommendations  Rolling walker with 5" wheels;3in1 (PT)    Recommendations for Other Services       Precautions / Restrictions Precautions Precautions: Fall Precaution Comments: pt mildly impulsive this session Restrictions Weight Bearing Restrictions: No    Mobility  Bed Mobility Overal bed mobility: Needs Assistance Bed Mobility: Supine to Sit     Supine to sit: Modified independent (Device/Increase time) Sit to supine: Supervision   General bed mobility comments: pt gets OOB and goes to the bathroom when PT leaves room to get supplies  Transfers Overall transfer level: Needs assistance Equipment used: None Transfers: Sit to/from Stand Sit to Stand: Min guard         General transfer comment: [employer] for safety.  continues to move quickly  Ambulation/Gait Ambulation/Gait assistance: Min guard Ambulation Distance (Feet): 350 Feet Assistive device: None Gait Pattern/deviations: Step-through pattern Gait velocity: normal Gait velocity interpretation: at or above normal speed for age/gender General Gait Details: Normal velocity and good step through sequencing. Pt is able to maintain good midline gait without moving to left this session. No LOB noted. Pt continues to have impulsive movements and is anxious about returning home   Stairs            Wheelchair Mobility    Modified Rankin (Stroke Patients Only)       Balance Overall balance assessment: Needs assistance Sitting-balance support: No upper extremity supported;Feet supported Sitting balance-Leahy Scale: Good     Standing balance support: During functional activity;No upper extremity supported Standing balance-Leahy Scale: Fair Standing balance comment: able to maintain standing balance, but leans on wall to maintain balance                            Cognition Arousal/Alertness: Awake/alert Behavior During Therapy: WFL for tasks assessed/performed Overall Cognitive Status: Impaired/Different from baseline Area of Impairment: Following commands;Safety/judgement;Awareness                       Following Commands: Follows multi-step commands with increased time Safety/Judgement: Decreased awareness of safety;Decreased awareness of deficits Awareness: Emergent Problem Solving: Requires verbal cues General Comments: pt demonstates a decreased awareness of deficits. pt gets up to go to the bathroom and pulls off IV      Exercises  General Comments        Pertinent Vitals/Pain Pain Assessment: No/denies pain    Home Living                      Prior Function            PT Goals (current goals can now be found in the care plan section) Acute Rehab PT Goals Patient Stated Goal:  Go home soon Progress towards PT goals: Progressing toward goals    Frequency    Min 4X/week      PT Plan Current plan remains appropriate    Co-evaluation              AM-PAC PT "6 Clicks" Daily Activity  Outcome Measure  Difficulty turning over in bed (including adjusting bedclothes, sheets and blankets)?: None Difficulty moving from lying on back to sitting on the side of the bed? : None Difficulty sitting down on and standing up from a chair with arms (e.g., wheelchair, bedside commode, etc,.)?: A Little Help needed moving to and from a bed to chair (including a wheelchair)?: A Little Help needed walking in hospital room?: A Little Help needed climbing 3-5 steps with a railing? : Total 6 Click Score: 18    End of Session Equipment Utilized During Treatment: Gait belt Activity Tolerance: Patient tolerated treatment well Patient left: in bed;with call bell/phone within reach;with family/visitor present Nurse Communication: Mobility status PT Visit Diagnosis: Unsteadiness on feet (R26.81);Other abnormalities of gait and mobility (R26.89);Other symptoms and signs involving the nervous system (R29.898);Hemiplegia and hemiparesis Hemiplegia - Right/Left: Left Hemiplegia - dominant/non-dominant: Non-dominant Hemiplegia - caused by: Cerebral infarction     Time: [id number] PT Time Calculation (min) (ACUTE ONLY): 22 min  Charges:                       G Codes:       [name] PT, DPT      [name] 01/21/2017, 12:55 PM

## 2017-01-21 NOTE — Progress Notes (Signed)
Occupational Therapy Treatment Patient Details Name: Victor Mcclain MRN: [id number] DOB: 25-Sep-1940 Today's Date: 01/21/2017    History of present illness 77 y.o. male with medical history significant of previously treated HTN (no longer on medications), Hx of BPH s/p what seems like a TURP, Hx of Renal Failure a few years ago requiring Hospitalization at [facility], and other comorbids who presented to [facility] ED with a cc of Unsteady Gait/Ataxia, Falling to the left side, with left sided weakness and Left facial droop. 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   OT comments  Pt progressing towards established OT goals. Pt presenting with increased balance during grooming and functional mobility compared to prior session and required [employer] for safety. Continues to present with L lateral drift during mobility. Educated and provided handout on fall prevention; pt verbalized understanding. Provided education on use of shower seat for safety and balance when bathing at home; pt verbalized understanding. Continue to recommend dc with follow to neuro OP OT and will continue to follow acutely to facilitate safe dc.    Follow Up Recommendations  Outpatient OT;Supervision/Assistance - 24 hour(Neuro OP OT)    Equipment Recommendations  None recommended by OT    Recommendations for Other Services PT consult    Precautions / Restrictions Precautions Precautions: Fall Restrictions Weight Bearing Restrictions: No       Mobility Bed Mobility Overal bed mobility: Needs Assistance Bed Mobility: Supine to Sit;Sit to Supine     Supine to sit: Supervision;HOB elevated Sit to supine: Supervision   General bed mobility comments: Supervision for safety. Required increased time  Transfers Overall transfer level: Needs assistance Equipment used: None;Rolling walker (2 wheeled) Transfers: Sit to/from Stand Sit to  Stand: Min guard         General transfer comment: Min Guard for safety. continues to move quickly    Balance Overall balance assessment: Needs assistance Sitting-balance support: No upper extremity supported;Feet supported Sitting balance-Leahy Scale: Fair     Standing balance support: During functional activity;Bilateral upper extremity supported Standing balance-Leahy Scale: Fair                             ADL either performed or assessed with clinical judgement   ADL Overall ADL's : Needs assistance/impaired     Grooming: Oral care;Standing;Min guard;Supervision/safety;Set up;Wash/dry face Grooming Details (indicate cue type and reason): Pt performing grooming at sink with Min Guard for safety and occasional supevision level. Pt demonstrating increased balance and decreased L lateral lean. Continues to use LUE less than baseline level.                Lower Body Dressing Details (indicate cue type and reason): Adjusted socks at EOB. Mod A for standing balance with dynamic tasks.            Tub/Shower Transfer Details (indicate cue type and reason): Educated pt on use of shower seat in shower for safety and balance. Pt reporting he has a built in seat and will not use 3N1 if provided. Functional mobility during ADLs: Min guard General ADL Comments: Focused session on standing balance at sink and with funcitonal mobility. Pt presenting with increased balance  compared to prior session. Continues to drift to L and stay near object on L.  Provided education on handout on fall prevention'pt verbalized understanding.      Vision  Perception     Praxis      Cognition Arousal/Alertness: Awake/alert Behavior During Therapy: WFL for tasks assessed/performed Overall Cognitive Status: Impaired/Different from baseline Area of Impairment: Following commands;Safety/judgement;Awareness                       Following Commands: Follows multi-step  commands with increased time Safety/Judgement: Decreased awareness of safety;Decreased awareness of deficits Awareness: Emergent Problem Solving: Requires verbal cues General Comments: Pt continues to demosntrate decreased awareness as seen during discussion of DME needs and fall prevention.         Exercises     Shoulder Instructions       General Comments      Pertinent Vitals/ Pain       Pain Assessment: No/denies pain  Home Living                                          Prior Functioning/Environment              Frequency  Min 2X/week        Progress Toward Goals  OT Goals(current goals can now be found in the care plan section)  Progress towards OT goals: Progressing toward goals  Acute Rehab OT Goals Patient Stated Goal: Go home soon OT Goal Formulation: With patient Time For Goal Achievement: 02/03/17 Potential to Achieve Goals: Good ADL Goals Pt Will Perform Grooming: standing;with modified independence Pt Will Perform Lower Body Dressing: with set-up;with supervision;sit to/from stand Pt Will Perform Tub/Shower Transfer: Shower transfer;3 in 1;with supervision;ambulating Additional ADL Goal #1: Pt will verbalize three fall prevention strategies with 1-2 VCs  Plan Discharge plan remains appropriate;Equipment recommendations need to be updated    Co-evaluation                 AM-PAC PT "6 Clicks" Daily Activity     Outcome Measure   Help from another person eating meals?: None Help from another person taking care of personal grooming?: A Little Help from another person toileting, which includes using toliet, bedpan, or urinal?: A Little Help from another person bathing (including washing, rinsing, drying)?: A Lot Help from another person to put on and taking off regular upper body clothing?: A Little Help from another person to put on and taking off regular lower body clothing?: A Lot 6 Click Score: 17    End of  Session Equipment Utilized During Treatment: Gait belt  OT Visit Diagnosis: Unsteadiness on feet (R26.81);Other abnormalities of gait and mobility (R26.89);Other symptoms and signs involving cognitive function;Ataxia, unspecified (R27.0);History of falling (Z91.81)   Activity Tolerance Patient tolerated treatment well   Patient Left in bed;with call bell/phone within reach;with bed alarm set;with family/visitor present(Sitting EOB with alarm)   Nurse Communication Mobility status        Time: [id number] OT Time Calculation (min): 22 min  Charges: OT General Charges $OT Visit: 1 Visit OT Treatments $Self Care/Home Management : 8-22 mins  Elois Averitt MSOT, OTR/L Acute Rehab Pager: [phone] Office: [phone]   Theodoro GristCharis M Verbie Babic 01/21/2017, 10:11 AM

## 2017-01-21 NOTE — Progress Notes (Signed)
PROGRESS NOTE    Victor Mcclain  ZOX:[id number] DOB: January 28, 1940 DOA: 01/19/2017 PCP: System, Pcp Not In   Brief Narrative:  Victor Mcclain is a 77 y.o. male with medical history significant of previously treated HTN (no longer on medications), Hx of BPH s/p what seems like a TURP, Hx of Renal Failure a few years ago requiring Hospitalization at [facility], and other comorbids who presented to [facility] ED with a cc of Unsteady Gait/Ataxia, Falling to the left side, with left sided weakness and Left facial droop. Last known well time was 9:00 pm last night. Patient woke up at 9:00 am this AM and wife stated that he was unsteady on his feet and was falling to the left side. She also noticed patient to have a facial droop. She also states he was not coordinated and because of concern patient was brought to the ED for evaluation. Patient's left facial droop has improved per wife but still remains slightly weak. [facility] was called to evaluate for Stroke Like Symptoms and admit for TIA vs. CVA. Neurology was called by EDP and evaluated patient in ED. 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 Neurology evaluated and recommending TEE/Loop Recorder to be done because they feel it was embolic in nature. TEE/Loop Recorder to be done Monday 01/22/17 after Neurology Discussion with Cardiology. 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 No significant vertebral artery stenosis. 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 Severe stenosis right posterior cerebral artery. Mild stenosis proximal left posterior cerebral artery. Anterior and middle cerebral arteries widely patent. 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 Bilateral posterior cerebral artery stenosis, severe in the proximal right P2 segment and moderate in the mid left P1 segment. -ECHO Done and showed EF of 60-65% and Grade 1 DD -Neurology consulted by EDP; Appreciate Recc's; Neurology feels like it was an embolic stroke so are recommending TEE/Loop Recorder to be placed on Monday and have reached out to Cardiology  -HbA1c was 5.4 -Patient's Lipid Panel showed Cholesterol of 167, HDL of 37, LDL of 107, TG of 115, VLDL of 23 -Started Atorvastatin 40 mg po qHS -C/w ASA 325 mg po Daily for now  -Neurochecks per Protocol -NPO Until Stroke Swallow Screen and SLP; Passed bedside Stroke Screen -PT/OT/SLP Consultation; PT/OT recommending Outpatient PT/OT and recommending Rolling Walker with 5" Wheels and 3in1 Bedside Commode  -Allow for permissive HTN; IVF increased to 75 mL/hr -Checked UDS and was Negative  Bradycardia -Asymptomatic  -Continue to Monitor on Telemetry  -Patient to have TEE/Loop Likely Monday 01/22/17  Hyperlipidemia -Patient's Lipid Panel showed Cholesterol of 167, HDL of 37, LDL of 107, TG of 115, VLDL of 23 -Started Atorvastatin 40 mg po qHS  Hx of HTN -No Longer  on Antihypertensives -Allow for Permissive HTN given acute CVA -BP was 146/77 this AM  Hx of BPH -s/p TURP -Continue to Monitor  Prior Smoker -Not an Active Issue  Obesity -Diet and Exercise recommended  Elevated Cr/CKD Stage  2 -BUN Cr on Admission was 12/1.19; no Baseline Cr -BUN/Cr went from 15/1.13 -> 18/1.34 but suspect he is near baseline -Suspect worsening was from Contrast -Will increase Patient's IVF to 75 mL/hr and if continues to worsen will get Urine Electrolytes to calculate FENa and obtain Retroperitoneal U/S in AM -Avoid Nephrotoxic Medications if Possible; Will D/C Enoxaparin sq and Start Heparin 5,000 units sq -Repeat CMP in AM   DVT prophylaxis: Enoxaparin D/C'd; Started Heparin 5,000 units sq Code Status: FULL CODE Family Communication: Discussed with sister at bedside  Disposition Plan: Outpatient PT/OT with Rolling Walker with 5" wheels and 3 in 1 when fully worked up and medically stable to D/C likely after TEE/Loop Insurance account manager:  Neurology Stroke Team   Procedures:  ECHOCARDIOGRAM ------------------------------------------------------------------- Study Conclusions  - Left ventricle: The cavity size was normal. Wall thickness was   increased in a pattern of mild LVH. Systolic function was normal.   The estimated ejection fraction was in the range of 60% to 65%.   Wall motion was normal; there were no regional wall motion   abnormalities. Doppler parameters are consistent with abnormal   left ventricular relaxation (grade 1 diastolic dysfunction). - Aortic valve: There was mild regurgitation. - Left atrium: The atrium was mildly dilated. - Atrial septum: No defect or patent foramen ovale was identified.    Antimicrobials:  Anti-infectives (From admission, onward)   None     Subjective: Seen and examined at bedside and stated he was feeling better and wanting to work with therapy. Had no complaints and denied CP.    Objective: Vitals:   01/20/17 1720 01/20/17 2200 01/21/17 0145 01/21/17 0528  BP: 127/78 (!) 145/71 136/75 (!) 142/90  Pulse: 61 64 66 (!) 57  Resp: 16 18 20 18   Temp: 97.8 F (36.6 C) 98.1 F (36.7 C) 98 F (36.7 C) 97.7 F (36.5 C)   TempSrc: Oral Oral Oral Oral  SpO2: 95% 93% 95% 98%  Weight:      Height:        Intake/Output Summary (Last 24 hours) at 01/21/2017 0801 Last data filed at 01/20/2017 1600 Gross per 24 hour  Intake 1464.17 ml  Output -  Net 1464.17 ml   Filed Weights   01/19/17 1411 01/19/17 2000  Weight: 104.3 kg (230 lb) 104.3 kg (229 lb 15 oz)   Examination: Physical Exam:  Constitutional: WN/WD obese Caucasian male in NAD appears calm and eating lunch at bedside.  Eyes: Sclerae anicteric. Left eye lid slightly sagging ENMT: External Ears and Nose appear normal. MMM; Still has a small facial droop Neck: Supple with no JVD Respiratory: CTAB; No appreciable wheezing/rales/rhonchi Cardiovascular: Bradycardic rate but Regular Rhythm. No appreciable Extremity edema Abdomen: Soft, NT, Distended due to body habitus GU: Deferred Musculoskeletal: No contractures; No cyanosis Skin: Warm and Dry. No appreciable rashes or lesions on a limited skin eval  Neurologic: Has slight left facial droop still. CN 2-12 otherwise grossly intact. Diminished strength on Left arm/Grip Strength. Gait not examined  Psychiatric: Normal mood and affect. Intact judgement and insight  Data Reviewed: I have personally reviewed following labs and imaging studies  CBC: Recent Labs  Lab 01/19/17 1435 01/19/17 1448 01/20/17 0411 01/21/17 0616  WBC 7.8  --  7.8 8.3  NEUTROABS 5.3  --  5.1 5.2  HGB 14.5 14.6 14.7 14.7  HCT 43.3 43.0 42.4 44.1  MCV 97.3  --  97.0 97.8  PLT 261  --  256 245   Basic Metabolic Panel: Recent Labs  Lab 01/19/17 1435 01/19/17 1448 01/20/17 0411 01/21/17 0616  NA 140 142 138 140  K 3.8 4.1 3.5 3.8  CL 105 100* 104 104  CO2 27  --  24 27  GLUCOSE 96 92 101* 109*  BUN 12 19 15 18   CREATININE 1.19 1.20 1.13 1.34*  CALCIUM 9.1  --  9.0 8.8*  MG  --   --  2.2 2.2  PHOS  --   --  3.8 3.2   GFR: Estimated Creatinine Clearance: 61.4 mL/min (A) (by C-G formula based on SCr of 1.34  mg/dL (H)). Liver Function Tests: Recent Labs  Lab 01/19/17 1435 01/20/17 0411 01/21/17 0616  AST 20 18 18   ALT 17 15* 14*  ALKPHOS 44 44 46  BILITOT 0.9 1.0 0.8  PROT 6.9 6.5 6.5  ALBUMIN 3.9 3.7 3.7   No results for input(s): LIPASE, AMYLASE in the last 168 hours. No results for input(s): AMMONIA in the last 168 hours. Coagulation Profile: Recent Labs  Lab 01/19/17 1435  INR 1.00   Cardiac Enzymes: No results for input(s): CKTOTAL, CKMB, CKMBINDEX, TROPONINI in the last 168 hours. BNP (last 3 results) No results for input(s): PROBNP in the last 8760 hours. HbA1C: Recent Labs    01/20/17 0411  HGBA1C 5.4   CBG: No results for input(s): GLUCAP in the last 168 hours. Lipid Profile: Recent Labs    01/20/17 0411  CHOL 167  HDL 37*  LDLCALC 107*  TRIG 115  CHOLHDL 4.5   Thyroid Function Tests: No results for input(s): TSH, T4TOTAL, FREET4, T3FREE, THYROIDAB in the last 72 hours. Anemia Panel: No results for input(s): VITAMINB12, FOLATE, FERRITIN, TIBC, IRON, RETICCTPCT in the last 72 hours. Sepsis Labs: No results for input(s): PROCALCITON, LATICACIDVEN in the last 168 hours.  No results found for this or any previous visit (from the past 240 hour(s)).   Radiology Studies: Ct Angio Head W Or Wo Contrast  Result Date: 01/19/2017 CLINICAL DATA:  Stroke, ataxia EXAM: CT ANGIOGRAPHY HEAD AND NECK TECHNIQUE: Multidetector CT imaging of the head and neck was performed using the standard protocol during bolus administration of intravenous contrast. Multiplanar CT image reconstructions and MIPs were obtained to evaluate the vascular anatomy. Carotid stenosis measurements (when applicable) are obtained utilizing NASCET criteria, using the distal internal carotid diameter as the denominator. CONTRAST:  > 50 mL ISOVUE-370 IOPAMIDOL (ISOVUE-370) INJECTION 76% COMPARISON:  CT head 01/19/2017 FINDINGS: CTA NECK FINDINGS Aortic arch: Mild atherosclerotic disease aortic arch  without aneurysm or dissection. Proximal great vessels widely patent. Right carotid system: Mild atherosclerotic calcification proximal right internal carotid artery without significant stenosis. Left carotid system: Mild atherosclerotic calcification proximal left internal carotid artery without significant stenosis. Vertebral arteries: Right vertebral artery dominant. Both vertebral arteries are patent to the basilar without significant stenosis Skeleton: Cervical spondylosis without acute skeletal abnormality. Other neck: Negative for mass or adenopathy. Upper chest: Negative Review of the MIP images confirms the above findings CTA HEAD FINDINGS Anterior circulation: Cavernous carotid widely patent bilaterally. Anterior and middle cerebral arteries widely patent without stenosis Posterior circulation: Right vertebral artery dominant. Fusiform dilatation of the distal right vertebral artery proximal to the basilar. 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 PICA patent bilaterally.  Basilar normal in caliber and widely patent. Severe stenosis mid right posterior cerebral artery. Mild stenosis left posterior cerebral artery. Posterior communicating artery patent bilaterally. Venous sinuses: Patent Anatomic variants: None Delayed phase: Normal enhancement. Review of the MIP images confirms the above findings IMPRESSION: Mild atherosclerotic disease the carotid bifurcation bilaterally without stenosis. No significant vertebral artery stenosis. 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 Severe stenosis right posterior cerebral artery. Mild stenosis proximal left posterior cerebral artery. Anterior and middle cerebral arteries widely patent. Negative for emergent large vessel occlusion. Electronically Signed   By: Marlan Palau M.D.   On: 01/19/2017 18:37   Dg Chest 2 View  Result Date: 01/20/2017 CLINICAL DATA:  Shortness of breath.  MRI clearance. EXAM: CHEST  2 VIEW COMPARISON:   None. FINDINGS: The heart size and mediastinal contours are within normal limits. Both lungs are clear. The visualized skeletal structures are unremarkable. No metallic object. IMPRESSION: No active cardiopulmonary disease.  No metallic foreign body. Electronically Signed   By: Deatra Robinson M.D.   On: 01/20/2017 03:50   Ct Angio Neck W Or Wo Contrast  Result Date: 01/19/2017 CLINICAL DATA:  Stroke, ataxia EXAM: CT ANGIOGRAPHY HEAD AND NECK TECHNIQUE: Multidetector CT imaging of the head and neck was performed using the standard protocol during bolus administration of intravenous contrast. Multiplanar CT image reconstructions and MIPs were obtained to evaluate the vascular anatomy. Carotid stenosis measurements (when applicable) are obtained utilizing NASCET criteria, using the distal internal carotid diameter as the denominator. CONTRAST:  > 50 mL ISOVUE-370 IOPAMIDOL (ISOVUE-370) INJECTION 76% COMPARISON:  CT head 01/19/2017 FINDINGS: CTA NECK FINDINGS Aortic arch: Mild atherosclerotic disease aortic arch without aneurysm or dissection. Proximal great vessels widely patent. Right carotid system: Mild atherosclerotic calcification proximal right internal carotid artery without significant stenosis. Left carotid system: Mild atherosclerotic calcification proximal left internal carotid artery without significant stenosis. Vertebral arteries: Right vertebral artery dominant. Both vertebral arteries are patent to the basilar without significant stenosis Skeleton: Cervical spondylosis without acute skeletal abnormality. Other neck: Negative for mass or adenopathy. Upper chest: Negative Review of the MIP images confirms the above findings CTA HEAD FINDINGS Anterior circulation: Cavernous carotid widely patent bilaterally. Anterior and middle cerebral arteries widely patent without stenosis Posterior circulation: Right vertebral artery dominant. Fusiform dilatation of the distal right vertebral artery proximal to the  basilar. 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 PICA patent bilaterally. Basilar normal in caliber and widely patent. Severe stenosis mid right posterior cerebral artery. Mild stenosis left posterior cerebral artery. Posterior communicating artery patent bilaterally. Venous sinuses: Patent Anatomic variants: None Delayed phase: Normal enhancement. Review of the MIP images confirms the above findings IMPRESSION: Mild atherosclerotic disease the carotid bifurcation bilaterally without stenosis. No significant vertebral artery stenosis. 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 Severe stenosis right posterior cerebral artery. Mild stenosis proximal left posterior cerebral artery. Anterior and middle cerebral arteries widely patent. Negative for emergent large vessel occlusion. Electronically Signed   By: Marlan Palau M.D.   On: 01/19/2017 18:37   Mr Brain Wo Contrast  Result Date: 01/20/2017 CLINICAL DATA:  Unsteady gait, left facial droop and left-sided weakness EXAM: MRI HEAD WITHOUT CONTRAST MRA HEAD WITHOUT CONTRAST TECHNIQUE: Multiplanar, multiecho pulse sequences of the brain and surrounding structures were obtained without intravenous contrast. Angiographic images of the head were obtained using MRA technique without contrast. COMPARISON:  CTA head neck 01/19/2017 FINDINGS: MRI HEAD FINDINGS Brain: The midline structures are normal. There is focal  diffusion restriction within the anteromedial right thalamus, measuring approximately 1.6 cm. There is mild periventricular leukoaraiosis, most commonly seen in the setting of chronic ischemic microangiopathy. No mass lesion. No chronic microhemorrhage or cerebral amyloid angiopathy. No hydrocephalus, age advanced atrophy or lobar predominant volume loss. No dural abnormality or extra-axial collection. Skull and upper cervical spine: The visualized skull base, calvarium, upper cervical spine and extracranial soft tissues are  normal. Sinuses/Orbits: No fluid levels or advanced mucosal thickening. No mastoid effusion. Normal orbits. MRA HEAD FINDINGS Intracranial internal carotid arteries: Normal. Anterior cerebral arteries: Normal. Middle cerebral arteries: Normal. Posterior communicating arteries: Present bilaterally. Posterior cerebral arteries: Bilateral fetal origins. Severe stenosis of the proximal right P2 segment. Moderate stenosis of the left P1 segment. Basilar artery: Normal. Vertebral arteries: Right-dominant. Fusiform dilatation of the right vertebral artery. Superior cerebellar arteries: Normal. Anterior inferior cerebellar arteries: Not clearly visualized, but this is not uncommon. Posterior inferior cerebellar arteries: Normal. IMPRESSION: 1. Small acute ischemic infarct of the anteromedial right thalamus without hemorrhage or mass effect. 2. Bilateral posterior cerebral artery stenosis, severe in the proximal right P2 segment and moderate in the mid left P1 segment. Electronically Signed   By: Deatra Robinson M.D.   On: 01/20/2017 03:44   Mr Maxine Glenn Head Wo Contrast  Result Date: 01/20/2017 CLINICAL DATA:  Unsteady gait, left facial droop and left-sided weakness EXAM: MRI HEAD WITHOUT CONTRAST MRA HEAD WITHOUT CONTRAST TECHNIQUE: Multiplanar, multiecho pulse sequences of the brain and surrounding structures were obtained without intravenous contrast. Angiographic images of the head were obtained using MRA technique without contrast. COMPARISON:  CTA head neck 01/19/2017 FINDINGS: MRI HEAD FINDINGS Brain: The midline structures are normal. There is focal diffusion restriction within the anteromedial right thalamus, measuring approximately 1.6 cm. There is mild periventricular leukoaraiosis, most commonly seen in the setting of chronic ischemic microangiopathy. No mass lesion. No chronic microhemorrhage or cerebral amyloid angiopathy. No hydrocephalus, age advanced atrophy or lobar predominant volume loss. No dural  abnormality or extra-axial collection. Skull and upper cervical spine: The visualized skull base, calvarium, upper cervical spine and extracranial soft tissues are normal. Sinuses/Orbits: No fluid levels or advanced mucosal thickening. No mastoid effusion. Normal orbits. MRA HEAD FINDINGS Intracranial internal carotid arteries: Normal. Anterior cerebral arteries: Normal. Middle cerebral arteries: Normal. Posterior communicating arteries: Present bilaterally. Posterior cerebral arteries: Bilateral fetal origins. Severe stenosis of the proximal right P2 segment. Moderate stenosis of the left P1 segment. Basilar artery: Normal. Vertebral arteries: Right-dominant. Fusiform dilatation of the right vertebral artery. Superior cerebellar arteries: Normal. Anterior inferior cerebellar arteries: Not clearly visualized, but this is not uncommon. Posterior inferior cerebellar arteries: Normal. IMPRESSION: 1. Small acute ischemic infarct of the anteromedial right thalamus without hemorrhage or mass effect. 2. Bilateral posterior cerebral artery stenosis, severe in the proximal right P2 segment and moderate in the mid left P1 segment. Electronically Signed   By: Deatra Robinson M.D.   On: 01/20/2017 03:44   Ct Head Code Stroke Wo Contrast  Addendum Date: 01/19/2017   ADDENDUM REPORT: 01/19/2017 15:46 ADDENDUM: These results were called by telephone at the time of interpretation on 01/19/2017 at 3:46 pm to Dr. Otelia Limes, who verbally acknowledged these results. Electronically Signed   By: Marlan Palau M.D.   On: 01/19/2017 15:46   Result Date: 01/19/2017 CLINICAL DATA:  Code stroke. Focal neuro deficit greater than 6 hours. Left-sided weakness and facial droop EXAM: CT HEAD WITHOUT CONTRAST TECHNIQUE: Contiguous axial images were obtained from the base of the skull through the  vertex without intravenous contrast. COMPARISON:  None. FINDINGS: Brain: Ventricle size and cerebral volume normal for age. Negative for acute infarct,  hemorrhage, or mass. No fluid collection or shift of the midline structures. Vascular: Negative for hyperdense vessel Skull: Negative Sinuses/Orbits: Mild mucosal edema paranasal sinuses. Right cataract removal. No orbital mass. Other: None ASPECTS (Alberta Stroke Program Early CT Score) - Ganglionic level infarction (caudate, lentiform nuclei, internal capsule, insula, M1-M3 cortex): 7 - Supraganglionic infarction (M4-M6 cortex): 3 Total score (0-10 with 10 being normal): 10 IMPRESSION: 1. No acute abnormality 2. ASPECTS is 10 3. I paged the stroke neurologist at this time. Electronically Signed: By: Marlan Palauharles  Clark M.D. On: 01/19/2017 14:59   Scheduled Meds: . aspirin  300 mg Rectal Daily   Or  . aspirin  325 mg Oral Daily  . atorvastatin  40 mg Oral q1800  . enoxaparin (LOVENOX) injection  40 mg Subcutaneous Q24H   Continuous Infusions: . sodium chloride 50 mL/hr at 01/19/17 2019    LOS: 1 day   Merlene Laughtermair Latif Sheikh, DO [facility] Pager [phone]  If 7PM-7AM, please contact night-coverage [email or web address] Password [email or web address] 01/21/2017, 8:01 AM

## 2017-01-22 ENCOUNTER — Encounter (HOSPITAL_COMMUNITY)
Admission: EM | Disposition: A | Discharge status: Home / Self Care | Payer: Self-pay | Source: Home / Self Care | Attending: Internal Medicine

## 2017-01-22 ENCOUNTER — Inpatient Hospital Stay (HOSPITAL_COMMUNITY): Payer: Medicare HMO | Admitting: Certified Registered Nurse Anesthetist

## 2017-01-22 ENCOUNTER — Inpatient Hospital Stay (HOSPITAL_COMMUNITY): Payer: Medicare HMO

## 2017-01-22 ENCOUNTER — Encounter (HOSPITAL_COMMUNITY): Payer: Self-pay | Admitting: Physician Assistant

## 2017-01-22 DIAGNOSIS — I6389 Other cerebral infarction: Secondary | ICD-10-CM

## 2017-01-22 DIAGNOSIS — I63019 Cerebral infarction due to thrombosis of unspecified vertebral artery: Secondary | ICD-10-CM

## 2017-01-22 DIAGNOSIS — I639 Cerebral infarction, unspecified: Secondary | ICD-10-CM

## 2017-01-22 DIAGNOSIS — I340 Nonrheumatic mitral (valve) insufficiency: Secondary | ICD-10-CM

## 2017-01-22 HISTORY — PX: TEE WITHOUT CARDIOVERSION: SHX5443

## 2017-01-22 HISTORY — PX: LOOP RECORDER INSERTION: EP1214

## 2017-01-22 LAB — COMPREHENSIVE METABOLIC PANEL
ALK PHOS: 43 U/L (ref 38–126)
ALT: 13 U/L — ABNORMAL LOW (ref 17–63)
ANION GAP: 12 (ref 5–15)
AST: 19 U/L (ref 15–41)
Albumin: 3.4 g/dL — ABNORMAL LOW (ref 3.5–5.0)
BUN: 19 mg/dL (ref 6–20)
CALCIUM: 8.7 mg/dL — AB (ref 8.9–10.3)
CO2: 22 mmol/L (ref 22–32)
CREATININE: 1.2 mg/dL (ref 0.61–1.24)
Chloride: 107 mmol/L (ref 101–111)
GFR calc Af Amer: >60 mL/min (ref >60)
GFR, EST NON AFRICAN AMERICAN: 58 mL/min — AB (ref >60)
Glucose, Bld: 99 mg/dL (ref 65–99)
Potassium: 4.2 mmol/L (ref 3.5–5.1)
SODIUM: 141 mmol/L (ref 135–145)
Total Bilirubin: 0.8 mg/dL (ref 0.3–1.2)
Total Protein: 6.2 g/dL — ABNORMAL LOW (ref 6.5–8.1)

## 2017-01-22 LAB — CBC WITH DIFFERENTIAL/PLATELET
Basophils Absolute: 0 10*3/uL (ref 0.0–0.1)
Basophils Relative: 1 %
EOS ABS: 0.4 10*3/uL (ref 0.0–0.7)
EOS PCT: 4 %
HCT: 42.8 % (ref 39.0–52.0)
HEMOGLOBIN: 14.2 g/dL (ref 13.0–17.0)
LYMPHS ABS: 1.8 10*3/uL (ref 0.7–4.0)
LYMPHS PCT: 21 %
MCH: 32.6 pg (ref 26.0–34.0)
MCHC: 33.2 g/dL (ref 30.0–36.0)
MCV: 98.2 fL (ref 78.0–100.0)
Monocytes Absolute: 0.9 10*3/uL (ref 0.1–1.0)
Monocytes Relative: 10 %
Neutro Abs: 5.5 10*3/uL (ref 1.7–7.7)
Neutrophils Relative %: 64 %
PLATELETS: 255 10*3/uL (ref 150–400)
RBC: 4.36 MIL/uL (ref 4.22–5.81)
RDW: 12.4 % (ref 11.5–15.5)
WBC: 8.6 10*3/uL (ref 4.0–10.5)

## 2017-01-22 LAB — PHOSPHORUS: Phosphorus: 3.6 mg/dL (ref 2.5–4.6)

## 2017-01-22 LAB — MAGNESIUM: MAGNESIUM: 2.1 mg/dL (ref 1.7–2.4)

## 2017-01-22 SURGERY — ECHOCARDIOGRAM, TRANSESOPHAGEAL
Anesthesia: Monitor Anesthesia Care

## 2017-01-22 SURGERY — LOOP RECORDER INSERTION

## 2017-01-22 MED ORDER — MIDAZOLAM HCL 5 MG/5ML IJ SOLN
INTRAMUSCULAR | Status: AC
  Filled 2017-01-22: qty 5

## 2017-01-22 MED ORDER — FENTANYL CITRATE (PF) 100 MCG/2ML IJ SOLN
INTRAMUSCULAR | Status: AC
Start: 2017-01-22 — End: ?
  Filled 2017-01-22: qty 2

## 2017-01-22 MED ORDER — MIDAZOLAM HCL 5 MG/5ML IJ SOLN
INTRAMUSCULAR | Status: DC | PRN
  Administered 2017-01-22 (×2): 1 mg via INTRAVENOUS

## 2017-01-22 MED ORDER — FENTANYL CITRATE (PF) 100 MCG/2ML IJ SOLN
INTRAMUSCULAR | Status: DC | PRN
  Administered 2017-01-22: 25 ug via INTRAVENOUS

## 2017-01-22 MED ORDER — ATORVASTATIN CALCIUM 40 MG PO TABS: 40.0000 mg | ORAL_TABLET | Freq: Every day | ORAL | 0 refills | Status: DC

## 2017-01-22 MED ORDER — PROPOFOL 500 MG/50ML IV EMUL
INTRAVENOUS | Status: DC | PRN
  Administered 2017-01-22: 75 ug/kg/min via INTRAVENOUS

## 2017-01-22 MED ORDER — PROPOFOL 10 MG/ML IV BOLUS
INTRAVENOUS | Status: DC | PRN
  Administered 2017-01-22: 10 mg via INTRAVENOUS
  Administered 2017-01-22: 20 mg via INTRAVENOUS
  Administered 2017-01-22: 10 mg via INTRAVENOUS

## 2017-01-22 MED ORDER — ASPIRIN 325 MG PO TABS: 325.0000 mg | ORAL_TABLET | Freq: Every day | ORAL | 0 refills | Status: DC

## 2017-01-22 SURGICAL SUPPLY — 3 items
LOOP REVEAL LINQSYS (Prosthesis & Implant Heart) ×3 IMPLANT
PACK LOOP INSERTION (CUSTOM PROCEDURE TRAY) ×3 IMPLANT
PAD DEFIB LIFELINK (PAD) ×3 IMPLANT

## 2017-01-22 NOTE — CV Procedure (Signed)
    PROCEDURE NOTE:  Procedure:  Transesophageal echocardiogram Operator:  Armanda Magicraci Indy Prestwood, MD Indications:  CVA Complications: None  During this procedure the patient is administered a total of Propofol 140 mg to achieve and maintain moderate conscious sedation.  The patient's heart rate, blood pressure, and oxygen saturation are monitored continuously during the procedure by anesthesia.   Results: Normal LV size and function Normal RV size and function Normal RA Mildly dilated  LA Normal TV with mild TR Normal PV Normal MV with mild MR Normal trileaflet AV with mild AR A PFO was noted by agitated saline contrast with bidirectional flow   Mild atherosclerosis of the ascending aorta.  The patient tolerated the procedure well and was transferred back to their room in stable condition.  Signed: Armanda Magicraci Kaja Jackowski, MD [facility]

## 2017-01-22 NOTE — Progress Notes (Signed)
Preliminary results by tech - Venous Duplex Lower Ext. Completed. Negative for deep and superficial vein thrombosis in both legs.  Barack Nicodemus, BS, RDMS, RVT  

## 2017-01-22 NOTE — Anesthesia Postprocedure Evaluation (Signed)
Anesthesia Post Note  Patient: Victor Mcclain  Procedure(s) Performed: TRANSESOPHAGEAL ECHOCARDIOGRAM (TEE) (N/A )     Patient location during evaluation: Endoscopy Anesthesia Type: MAC Level of consciousness: awake and alert Pain management: pain level controlled Vital Signs Assessment: post-procedure vital signs reviewed and stable Respiratory status: spontaneous breathing, nonlabored ventilation, respiratory function stable and patient connected to nasal cannula oxygen Cardiovascular status: stable and blood pressure returned to baseline Postop Assessment: no apparent nausea or vomiting Anesthetic complications: no    Last Vitals:  Vitals:   01/22/17 1230 01/22/17 1240  BP: (!) 146/72 (!) 148/74  Pulse: 64 (!) 54  Resp: 17 10  Temp:    SpO2: 93% 96%    Last Pain:  Vitals:   01/22/17 1218  TempSrc: Oral  PainSc:                  Barnet [place]

## 2017-01-22 NOTE — Discharge Summary (Signed)
Physician Discharge Summary  Victor Mcclain ZOX:[id number] DOB: 16-Feb-1940 DOA: 01/19/2017  PCP: System, Pcp Not In  Admit date: 01/19/2017 Discharge date: 01/22/2017  Admitted From: Home Disposition: Home with outpatient PT/OT  Recommendations for Outpatient Follow-up:  1. Follow up with PCP in 1-2 weeks 2. Follow up with Neurology Dr. Pearlean Brownie in 6 weeks 3. Follow up with Cardiology Clinic 2/4 for Incision check 4. Please obtain CMP/CBC, Mag. Phos in one week 5. Please follow up on the following pending results:  Home Health: No, Outpatient PT/OT  Equipment/Devices: Rolling Walker with 5" wheels; 3in1  Discharge Condition: Stable CODE STATUS: FULL CODE Diet recommendation: Heart Healthy Diet  Brief/Interim Summary: Victor Mcclain a 77 y.o.malewith medical history significant ofpreviously treated HTN (no longer on medications), Hx of BPH s/p what seems like a TURP, Hx of Renal Failure a few years ago requiring Hospitalization at [facility], and other comorbids who presented to [facility] ED with a cc of Unsteady Gait/Ataxia, Falling to the left side, with left sided weakness and Left facial droop. Last known well time was 9:00 pm last night. Patient woke up at 9:00 am this AM and wife stated that he was unsteady on his feet and was falling to the left side. She also noticed patient to have a facial droop. She also states he was not coordinated and because of concern patient was brought to the ED for evaluation. Patient's left facial droop has improved per wife but still remains slightly weak. TRH was called to evaluate for Stroke Like Symptoms and admit for TIA vs. CVA. Neurology was called by EDP and evaluated patient in ED.   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 Neurology evaluated and recommended complete stroke workup as well as TEE/Loop Recorder to be done because they felt it was embolic in nature. TEE/Loop Recorder  done today Monday 01/22/17 and it was found that the patient had a PFO so U/S of LE was done and was Negative for DVT. PT/OT evaluated and recommended Outpatient PT. Patient's Cr slightly bumped during the hospitalization so IVF were increased to 75 mL/hr with improvement in Cr. After TEE/LOOP patient was deemed medically stable to D/C Home with Outpatient PT/OT and will need to follow up with PCP, Neurology, and Electrophysiology as an outpatient.   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 No significant vertebral artery stenosis. 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 Severe stenosis right posterior cerebral artery. Mild stenosis proximal left posterior cerebral artery. Anterior and middle cerebral arteries widely patent. 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 Bilateral posterior cerebral artery stenosis, severe in the proximal right P2 segment and moderate in the mid left P1 segment. -ECHO Done and showed EF of 60-65% and Grade 1 DD -Neurology  consulted by EDP; Appreciate Recc's; Neurology feels like it was an embolic stroke so are recommending TEE/Loop Recorder  -TEE Done and showed A PFO was noted by agitated saline  contrast with bidirectional flow   -LE Duplex was done and showed Negative for deep and superficial vein thrombosis in both legs -LOOP Recorder placed and Follow up scheduled for incision check -HbA1c was 5.4 -Patient's Lipid Panel showed Cholesterol of 167, HDL of 37, LDL of 107, TG of 115, VLDL of 23 -Started Atorvastatin 40 mg po qHS -C/w ASA 325 mg po Daily for now  -Neurochecks per Protocol -NPO Until Stroke Swallow Screen and SLP; Passed bedside Stroke Screen -PT/OT/SLP Consultation; PT/OT recommending Outpatient PT/OT and recommending Rolling Walker with 5" Wheels and 3in1 Bedside Commode  -Allowed for permissive HTN; IVF increased to 75 mL/hr -Checked UDS and was Negative -Follow up with PCP within 1 week and Neurology Dr. Pearlean Brownie in 6 weeks   Bradycardia -Asymptomatic  -Continue to Monitor on Telemetry  -Patient to have TEE/Loop Likely Monday 01/22/17  Hyperlipidemia -Patient's Lipid Panel showed Cholesterol of 167, HDL of 37, LDL of 107, TG of 115, VLDL of 23 -Started Atorvastatin 40 mg po qHS  Hx of HTN -No Longer on Antihypertensives -Allow for Permissive HTN given acute CVA -BP was 127/65 prior to D/C  Hx of BPH -s/p TURP -Continue to Monitor and follow up as an outpatient   Prior Smoker -Not an Active Issue  Obesity -Diet and Exercise recommended  Elevated Cr/CKD Stage 2 -BUN Cr on Admission was 12/1.19; no Baseline Cr -BUN/Cr went from 15/1.13 -> 18/1.34 but suspect he is near baseline -Suspect worsening was from Contrast -Will increase Patient's IVF to 75 mL/hr and if continues to worsen will get Urine Electrolytes to calculate FENa and obtain Retroperitoneal U/S in AM -BUN/Cr improved to 19/1.20 -Avoid Nephrotoxic Medications if Possible; Will D/C Enoxaparin sq and Start Heparin 5,000 units sq -Repeat CMP as an outpatient and have evaluation for CKD worked up as an outpatient   Discharge Instructions Discharge Instructions    Ambulatory referral  to Neurology   Complete by:  As directed    An appointment is requested in approximately: 6 weeks   Ambulatory referral to Occupational Therapy   Complete by:  As directed    Ambulatory referral to Physical Therapy   Complete by:  As directed    Call MD for:  difficulty breathing, headache or visual disturbances   Complete by:  As directed    Call MD for:  extreme fatigue   Complete by:  As directed    Call MD for:  hives   Complete by:  As directed    Call MD for:  persistant dizziness or light-headedness   Complete by:  As directed    Call MD for:  persistant nausea and vomiting   Complete by:  As directed    Call MD for:  redness, tenderness, or signs of infection (pain, swelling, redness, odor or green/yellow discharge around incision site)   Complete by:  As directed    Call MD for:  severe uncontrolled pain   Complete by:  As directed    Call MD for:  temperature >100.4   Complete by:  As directed    Diet - low sodium heart healthy   Complete by:  As directed    Discharge instructions   Complete by:  As directed    Follow up with PCP and Neurology as an outpatient. Take all medications as  prescribed. If symptoms change or worsen please return to the ED for evaluation.   Increase activity slowly   Complete by:  As directed      Allergies as of 01/22/2017      Reactions   Novocain [procaine] Anaphylaxis   And any similar meds      Medication List    TAKE these medications   acetaminophen 500 MG tablet Commonly known as:  TYLENOL Take 1,000 mg by mouth every 6 (six) hours as needed.   aspirin 325 MG tablet Take 1 tablet (325 mg total) by mouth daily. Start taking on:  01/23/2017   atorvastatin 40 MG tablet Commonly known as:  LIPITOR Take 1 tablet (40 mg total) by mouth daily at 6 PM.   glucosamine-chondroitin 500-400 MG tablet Take 1 tablet by mouth daily.   multivitamin tablet Take 1 tablet by mouth daily.      Follow-up Information    [facility]  [address] Follow up on 02/05/2017.   Specialty:  Cardiology Why:  4:00PM, wound check visit Contact information: [address], [address] [place] [address] [phone]         Allergies  Allergen Reactions  . Novocain [Procaine] Anaphylaxis    And any similar meds   Consultations:  Neurology  Cardiology for TEE  Electrophysiology for Implantable Loop Recorder   Procedures/Studies: Ct Angio Head W Or Wo Contrast  Result Date: 01/19/2017 CLINICAL DATA:  Stroke, ataxia EXAM: CT ANGIOGRAPHY HEAD AND NECK TECHNIQUE: Multidetector CT imaging of the head and neck was performed using the standard protocol during bolus administration of intravenous contrast. Multiplanar CT image reconstructions and MIPs were obtained to evaluate the vascular anatomy. Carotid stenosis measurements (when applicable) are obtained utilizing NASCET criteria, using the distal internal carotid diameter as the denominator. CONTRAST:  > 50 mL ISOVUE-370 IOPAMIDOL (ISOVUE-370) INJECTION 76% COMPARISON:  CT head 01/19/2017 FINDINGS: CTA NECK FINDINGS Aortic arch: Mild atherosclerotic disease aortic arch without aneurysm or dissection. Proximal great vessels widely patent. Right carotid system: Mild atherosclerotic calcification proximal right internal carotid artery without significant stenosis. Left carotid system: Mild atherosclerotic calcification proximal left internal carotid artery without significant stenosis. Vertebral arteries: Right vertebral artery dominant. Both vertebral arteries are patent to the basilar without significant stenosis Skeleton: Cervical spondylosis without acute skeletal abnormality. Other neck: Negative for mass or adenopathy. Upper chest: Negative Review of the MIP images confirms the above findings CTA HEAD FINDINGS Anterior circulation: Cavernous carotid widely patent bilaterally. Anterior and middle cerebral arteries widely patent without stenosis Posterior  circulation: Right vertebral artery dominant. Fusiform dilatation of the distal right vertebral artery proximal to the basilar. 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 PICA patent bilaterally. Basilar normal in caliber and widely patent. Severe stenosis mid right posterior cerebral artery. Mild stenosis left posterior cerebral artery. Posterior communicating artery patent bilaterally. Venous sinuses: Patent Anatomic variants: None Delayed phase: Normal enhancement. Review of the MIP images confirms the above findings IMPRESSION: Mild atherosclerotic disease the carotid bifurcation bilaterally without stenosis. No significant vertebral artery stenosis. 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 Severe stenosis right posterior cerebral artery. Mild stenosis proximal left posterior cerebral artery. Anterior and middle cerebral arteries widely patent. Negative for emergent large vessel occlusion. Electronically Signed   By: Marlan Palau M.D.   On: 01/19/2017 18:37   Dg Chest 2 View  Result Date: 01/20/2017 CLINICAL DATA:  Shortness of breath.  MRI clearance. EXAM: CHEST  2 VIEW COMPARISON:  None. FINDINGS: The heart size and mediastinal contours are within normal limits. Both lungs are clear. The visualized skeletal structures are unremarkable. No metallic object. IMPRESSION: No active cardiopulmonary disease.  No metallic foreign body. Electronically Signed   By: Deatra RobinsonKevin  Herman M.D.   On: 01/20/2017 03:50   Ct Angio Neck W Or Wo Contrast  Result Date: 01/19/2017 CLINICAL DATA:  Stroke, ataxia EXAM: CT ANGIOGRAPHY HEAD AND NECK TECHNIQUE: Multidetector CT imaging of the head and neck was performed using the standard protocol during bolus administration of intravenous contrast. Multiplanar CT image reconstructions and MIPs were obtained to evaluate the vascular anatomy. Carotid stenosis measurements (when applicable) are obtained utilizing NASCET criteria, using the distal  internal carotid diameter as the denominator. CONTRAST:  > 50 mL ISOVUE-370 IOPAMIDOL (ISOVUE-370) INJECTION 76% COMPARISON:  CT head 01/19/2017 FINDINGS: CTA NECK FINDINGS Aortic arch: Mild atherosclerotic disease aortic arch without aneurysm or dissection. Proximal great vessels widely patent. Right carotid system: Mild atherosclerotic calcification proximal right internal carotid artery without significant stenosis. Left carotid system: Mild atherosclerotic calcification proximal left internal carotid artery without significant stenosis. Vertebral arteries: Right vertebral artery dominant. Both vertebral arteries are patent to the basilar without significant stenosis Skeleton: Cervical spondylosis without acute skeletal abnormality. Other neck: Negative for mass or adenopathy. Upper chest: Negative Review of the MIP images confirms the above findings CTA HEAD FINDINGS Anterior circulation: Cavernous carotid widely patent bilaterally. Anterior and middle cerebral arteries widely patent without stenosis Posterior circulation: Right vertebral artery dominant. Fusiform dilatation of the distal right vertebral artery proximal to the basilar. 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 PICA patent bilaterally. Basilar normal in caliber and widely patent. Severe stenosis mid right posterior cerebral artery. Mild stenosis left posterior cerebral artery. Posterior communicating artery patent bilaterally. Venous sinuses: Patent Anatomic variants: None Delayed phase: Normal enhancement. Review of the MIP images confirms the above findings IMPRESSION: Mild atherosclerotic disease the carotid bifurcation bilaterally without stenosis. No significant vertebral artery stenosis. 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 Severe stenosis right posterior cerebral artery. Mild stenosis proximal left posterior cerebral artery. Anterior and middle cerebral arteries widely patent. Negative for emergent  large vessel occlusion. Electronically Signed   By: Marlan Palauharles  Clark M.D.   On: 01/19/2017 18:37   Mr Brain Wo Contrast  Result Date: 01/20/2017 CLINICAL DATA:  Unsteady gait, left facial droop and left-sided weakness EXAM: MRI HEAD WITHOUT CONTRAST MRA HEAD WITHOUT CONTRAST TECHNIQUE: Multiplanar, multiecho pulse sequences of the brain and surrounding structures were obtained without intravenous contrast. Angiographic images of the head were obtained using MRA technique without contrast. COMPARISON:  CTA head neck 01/19/2017 FINDINGS: MRI HEAD FINDINGS Brain: The midline structures are normal. There is focal diffusion restriction within the anteromedial right thalamus, measuring approximately 1.6 cm. There is mild periventricular leukoaraiosis, most commonly seen in the setting of chronic ischemic microangiopathy. No mass lesion. No chronic microhemorrhage or cerebral amyloid angiopathy. No hydrocephalus, age advanced atrophy or lobar predominant volume loss. No dural abnormality or extra-axial collection. Skull and upper cervical spine: The visualized skull base, calvarium, upper cervical spine and extracranial soft tissues are normal. Sinuses/Orbits: No fluid levels or advanced mucosal thickening. No mastoid effusion. Normal orbits. MRA HEAD FINDINGS Intracranial internal carotid arteries: Normal. Anterior cerebral arteries: Normal. Middle cerebral arteries: Normal. Posterior communicating arteries: Present bilaterally. Posterior cerebral arteries: Bilateral fetal origins. Severe stenosis of the proximal right P2 segment. Moderate stenosis of the left P1 segment. Basilar artery: Normal. Vertebral arteries: Right-dominant. Fusiform dilatation  of the right vertebral artery. Superior cerebellar arteries: Normal. Anterior inferior cerebellar arteries: Not clearly visualized, but this is not uncommon. Posterior inferior cerebellar arteries: Normal. IMPRESSION: 1. Small acute ischemic infarct of the anteromedial  right thalamus without hemorrhage or mass effect. 2. Bilateral posterior cerebral artery stenosis, severe in the proximal right P2 segment and moderate in the mid left P1 segment. Electronically Signed   By: Deatra Robinson M.D.   On: 01/20/2017 03:44   Mr Maxine Glenn Head Wo Contrast  Result Date: 01/20/2017 CLINICAL DATA:  Unsteady gait, left facial droop and left-sided weakness EXAM: MRI HEAD WITHOUT CONTRAST MRA HEAD WITHOUT CONTRAST TECHNIQUE: Multiplanar, multiecho pulse sequences of the brain and surrounding structures were obtained without intravenous contrast. Angiographic images of the head were obtained using MRA technique without contrast. COMPARISON:  CTA head neck 01/19/2017 FINDINGS: MRI HEAD FINDINGS Brain: The midline structures are normal. There is focal diffusion restriction within the anteromedial right thalamus, measuring approximately 1.6 cm. There is mild periventricular leukoaraiosis, most commonly seen in the setting of chronic ischemic microangiopathy. No mass lesion. No chronic microhemorrhage or cerebral amyloid angiopathy. No hydrocephalus, age advanced atrophy or lobar predominant volume loss. No dural abnormality or extra-axial collection. Skull and upper cervical spine: The visualized skull base, calvarium, upper cervical spine and extracranial soft tissues are normal. Sinuses/Orbits: No fluid levels or advanced mucosal thickening. No mastoid effusion. Normal orbits. MRA HEAD FINDINGS Intracranial internal carotid arteries: Normal. Anterior cerebral arteries: Normal. Middle cerebral arteries: Normal. Posterior communicating arteries: Present bilaterally. Posterior cerebral arteries: Bilateral fetal origins. Severe stenosis of the proximal right P2 segment. Moderate stenosis of the left P1 segment. Basilar artery: Normal. Vertebral arteries: Right-dominant. Fusiform dilatation of the right vertebral artery. Superior cerebellar arteries: Normal. Anterior inferior cerebellar arteries: Not  clearly visualized, but this is not uncommon. Posterior inferior cerebellar arteries: Normal. IMPRESSION: 1. Small acute ischemic infarct of the anteromedial right thalamus without hemorrhage or mass effect. 2. Bilateral posterior cerebral artery stenosis, severe in the proximal right P2 segment and moderate in the mid left P1 segment. Electronically Signed   By: Deatra Robinson M.D.   On: 01/20/2017 03:44   Ct Head Code Stroke Wo Contrast  Addendum Date: 01/19/2017   ADDENDUM REPORT: 01/19/2017 15:46 ADDENDUM: These results were called by telephone at the time of interpretation on 01/19/2017 at 3:46 pm to Dr. Otelia Limes, who verbally acknowledged these results. Electronically Signed   By: Marlan Palau M.D.   On: 01/19/2017 15:46   Result Date: 01/19/2017 CLINICAL DATA:  Code stroke. Focal neuro deficit greater than 6 hours. Left-sided weakness and facial droop EXAM: CT HEAD WITHOUT CONTRAST TECHNIQUE: Contiguous axial images were obtained from the base of the skull through the vertex without intravenous contrast. COMPARISON:  None. FINDINGS: Brain: Ventricle size and cerebral volume normal for age. Negative for acute infarct, hemorrhage, or mass. No fluid collection or shift of the midline structures. Vascular: Negative for hyperdense vessel Skull: Negative Sinuses/Orbits: Mild mucosal edema paranasal sinuses. Right cataract removal. No orbital mass. Other: None ASPECTS (Alberta Stroke Program Early CT Score) - Ganglionic level infarction (caudate, lentiform nuclei, internal capsule, insula, M1-M3 cortex): 7 - Supraganglionic infarction (M4-M6 cortex): 3 Total score (0-10 with 10 being normal): 10 IMPRESSION: 1. No acute abnormality 2. ASPECTS is 10 3. I paged the stroke neurologist at this time. Electronically Signed: By: Marlan Palau M.D. On: 01/19/2017 14:59   ECHOCARDIOGRAM ------------------------------------------------------------------- Study Conclusions  - Left ventricle: The cavity size was  normal. Wall thickness  was increased in a pattern of mild LVH. Systolic function was normal. The estimated ejection fraction was in the range of 60% to 65%. Wall motion was normal; there were no regional wall motion abnormalities. Doppler parameters are consistent with abnormal left ventricular relaxation (grade 1 diastolic dysfunction). - Aortic valve: There was mild regurgitation. - Left atrium: The atrium was mildly dilated. - Atrial septum: No defect or patent foramen ovale was identified.  TEE Normal LV size and function Normal RV size and function Normal RA Mildly dilated  LA Normal TV with mild TR Normal PV Normal MV with mild MR Normal trileaflet AV with mild AR A PFO was noted by agitated saline contrast with bidirectional flow   Mild atherosclerosis of the ascending aorta  LE DUPLEX Negative for deep and superficial vein thrombosis in both legs  Subjective: Seen and examined at bedside and was hungry. No CP or SOB. Wanting to go home.  Discharge Exam: Vitals:   01/22/17 1656 01/22/17 1700  BP: (!) 169/113 127/65  Pulse: (!) 0 (!) 0  Resp: (!) 0 (!) 0  Temp:    SpO2: 100% 100%   Vitals:   01/22/17 1646 01/22/17 1651 01/22/17 1656 01/22/17 1700  BP:   (!) 169/113 127/65  Pulse: (!) 0 (!) 0 (!) 0 (!) 0  Resp: (!) 0 (!) 0 (!) 0 (!) 0  Temp:      TempSrc:      SpO2: 99% 100% 100% 100%  Weight:      Height:       General: Pt is alert, awake, not in acute distress Cardiovascular: RRR, S1/S2 +, no rubs, no gallops Respiratory: CTA bilaterally, no wheezing, no rhonchi Abdominal: Soft, NT, ND, bowel sounds + Extremities: no edema, no cyanosis Neuro: Has some left hand weakness and mild facial droop  The results of significant diagnostics from this hospitalization (including imaging, microbiology, ancillary and laboratory) are listed below for reference.    Microbiology: No results found for this or any previous visit (from the past 240 hour(s)).    Labs: BNP (last 3 results) No results for input(s): BNP in the last 8760 hours. Basic Metabolic Panel: Recent Labs  Lab 01/19/17 1435 01/19/17 1448 01/20/17 0411 01/21/17 0616 01/22/17 0441  NA 140 142 138 140 141  K 3.8 4.1 3.5 3.8 4.2  CL 105 100* 104 104 107  CO2 27  --  24 27 22   GLUCOSE 96 92 101* 109* 99  BUN 12 19 15 18 19   CREATININE 1.19 1.20 1.13 1.34* 1.20  CALCIUM 9.1  --  9.0 8.8* 8.7*  MG  --   --  2.2 2.2 2.1  PHOS  --   --  3.8 3.2 3.6   Liver Function Tests: Recent Labs  Lab 01/19/17 1435 01/20/17 0411 01/21/17 0616 01/22/17 0441  AST 20 18 18 19   ALT 17 15* 14* 13*  ALKPHOS 44 44 46 43  BILITOT 0.9 1.0 0.8 0.8  PROT 6.9 6.5 6.5 6.2*  ALBUMIN 3.9 3.7 3.7 3.4*   No results for input(s): LIPASE, AMYLASE in the last 168 hours. No results for input(s): AMMONIA in the last 168 hours. CBC: Recent Labs  Lab 01/19/17 1435 01/19/17 1448 01/20/17 0411 01/21/17 0616 01/22/17 0441  WBC 7.8  --  7.8 8.3 8.6  NEUTROABS 5.3  --  5.1 5.2 5.5  HGB 14.5 14.6 14.7 14.7 14.2  HCT 43.3 43.0 42.4 44.1 42.8  MCV 97.3  --  97.0 97.8 98.2  PLT 261  --  256 245 255   Cardiac Enzymes: No results for input(s): CKTOTAL, CKMB, CKMBINDEX, TROPONINI in the last 168 hours. BNP: Invalid input(s): POCBNP CBG: No results for input(s): GLUCAP in the last 168 hours. D-Dimer No results for input(s): DDIMER in the last 72 hours. Hgb A1c Recent Labs    01/20/17 0411  HGBA1C 5.4   Lipid Profile Recent Labs    01/20/17 0411  CHOL 167  HDL 37*  LDLCALC 107*  TRIG 115  CHOLHDL 4.5   Thyroid function studies No results for input(s): TSH, T4TOTAL, T3FREE, THYROIDAB in the last 72 hours.  Invalid input(s): FREET3 Anemia work up No results for input(s): VITAMINB12, FOLATE, FERRITIN, TIBC, IRON, RETICCTPCT in the last 72 hours. Urinalysis    Component Value Date/Time   COLORURINE STRAW (A) 01/19/2017 1513   APPEARANCEUR CLEAR 01/19/2017 1513   LABSPEC 1.005  01/19/2017 1513   PHURINE 7.0 01/19/2017 1513   GLUCOSEU NEGATIVE 01/19/2017 1513   HGBUR NEGATIVE 01/19/2017 1513   BILIRUBINUR NEGATIVE 01/19/2017 1513   KETONESUR NEGATIVE 01/19/2017 1513   PROTEINUR NEGATIVE 01/19/2017 1513   NITRITE NEGATIVE 01/19/2017 1513   LEUKOCYTESUR NEGATIVE 01/19/2017 1513   Sepsis Labs Invalid input(s): PROCALCITONIN,  WBC,  LACTICIDVEN Microbiology No results found for this or any previous visit (from the past 240 hour(s)).  Time coordinating discharge: 35 minutes  SIGNED:  Merlene Laughter, DO [facility] 01/22/2017, 9:25 PM Pager [phone]  If 7PM-7AM, please contact night-coverage [email or web address] Password [email or web address]

## 2017-01-22 NOTE — Anesthesia Preprocedure Evaluation (Signed)
Anesthesia Evaluation  Patient identified by MRN, date of birth, ID band Patient awake    Reviewed: Allergy & Precautions, NPO status , Patient's Chart, lab work & pertinent test results  Airway Mallampati: II  TM Distance: >3 FB Neck ROM: Full    Dental no notable dental hx.    Pulmonary neg pulmonary ROS, former smoker,    Pulmonary exam normal breath sounds clear to auscultation       Cardiovascular negative cardio ROS Normal cardiovascular exam Rhythm:Regular Rate:Normal     Neuro/Psych negative neurological ROS  negative psych ROS   GI/Hepatic negative GI ROS, Neg liver ROS,   Endo/Other  negative endocrine ROSdiabetes  Renal/GU negative Renal ROS  negative genitourinary   Musculoskeletal negative musculoskeletal ROS (+)   Abdominal   Peds negative pediatric ROS (+)  Hematology negative hematology ROS (+)   Anesthesia Other Findings   Reproductive/Obstetrics negative OB ROS                             Anesthesia Physical Anesthesia Plan  ASA: II  Anesthesia Plan: MAC   Post-op Pain Management:    Induction: Intravenous  PONV Risk Score and Plan: Treatment may vary due to age or medical condition and Ondansetron  Airway Management Planned: Mask, Natural Airway and Nasal Cannula  Additional Equipment:   Intra-op Plan:   Post-operative Plan:   Informed Consent: I have reviewed the patients History and Physical, chart, labs and discussed the procedure including the risks, benefits and alternatives for the proposed anesthesia with the patient or authorized representative who has indicated his/her understanding and acceptance.   Dental advisory given  Plan Discussed with: CRNA and Anesthesiologist  Anesthesia Plan Comments:         Anesthesia Quick Evaluation

## 2017-01-22 NOTE — Consult Note (Signed)
ELECTROPHYSIOLOGY CONSULT NOTE  Patient ID: Victor NyJetty Najarian MRN: [id number], DOB/AGE: 08-06-1940   Admit date: 01/19/2017 Date of Consult: 01/22/2017  Primary Physician: System, Pcp Not In Primary Cardiologist: none Reason for Consultation: Cryptogenic stroke ; recommendations regarding Implantable Loop Recorder requested by Dr. Pearlean BrownieSethi  History of Present Illness Victor Mcclain was admitted on 01/19/2017 with an acute CVA.  They first developed symptoms while at home, after waking.  No PMHx known for the patient.  Imaging demonstrated Right thalamic infarct possibly embolic source unknown.  The patient has undergone workup for stroke including echocardiogram and carotid angio/dopplers.  He has been monitored on telemetry which has demonstrated sinus rhythm with no arrhythmias.  Inpatient stroke work-up is to be completed with a TEE.   Echocardiogram this admission demonstrated  Study Conclusions - Left ventricle: The cavity size was normal. Wall thickness was   increased in a pattern of mild LVH. Systolic function was normal.   The estimated ejection fraction was in the range of 60% to 65%.   Wall motion was normal; there were no regional wall motion   abnormalities. Doppler parameters are consistent with abnormal   left ventricular relaxation (grade 1 diastolic dysfunction). - Aortic valve: There was mild regurgitation. - Left atrium: The atrium was mildly dilated. - Atrial septum: No defect or patent foramen ovale was identified.   Lab work is reviewed.  01/20/2017 Final Interpretation: Right Carotid: Velocities in the right ICA are consistent with a 1-39% stenosis. Left Carotid: Velocities in the left ICA are consistent with a 1-39% stenosis. Vertebrals: Both vertebral arteries were patent with antegrade flow.    Prior to admission, the patient denies chest pain, shortness of breath, dizziness, palpitations, or syncope.  They are recovering from their stroke with plans to home at  discharge.   Past Medical History:  Diagnosis Date  . Cataract   . 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 Esophageal stretching       Medications Prior to Admission  Medication Sig Dispense Refill Last Dose  . acetaminophen (TYLENOL) 500 MG tablet Take 1,000 mg by mouth every 6 (six) hours as needed.   Past Week at Unknown time  . glucosamine-chondroitin 500-400 MG tablet Take 1 tablet by mouth daily.   01/19/2017 at Unknown time  . Multiple Vitamin (MULTIVITAMIN) tablet Take 1 tablet by mouth daily.   01/19/2017 at Unknown time    Inpatient Medications:  . aspirin  300 mg Rectal Daily   Or  . aspirin  325 mg Oral Daily  . atorvastatin  40 mg Oral q1800  . heparin injection (subcutaneous)  5,000 Units Subcutaneous Q8H    Allergies:  Allergies  Allergen Reactions  . Novocain [Procaine] Anaphylaxis    And any similar meds    Social History   Socioeconomic History  . Marital status: Married    Spouse name: Not on file  . Number of children: Not on file  . Years of education: Not on file  . Highest education level: Not on file  Social Needs  . Financial resource strain: Not on file  . Food insecurity - worry: Not on file  . Food insecurity - inability: Not on file  . Transportation needs - medical: Not on file  . Transportation needs - non-medical: Not on file  Occupational History  . Not on file  Tobacco Use  . Smoking status: Former Smoker  Packs/day: 3.00    Types: Cigarettes    Last attempt to quit: 01/19/1986    Years since quitting: 31.0  . Smokeless tobacco: Current User    Types: Chew  Substance and Sexual Activity  . Alcohol use: No    Frequency: Never  . Drug use: No  . Sexual activity: Yes  Other Topics Concern  . Not on file  Social History Narrative  . Not on file     History reviewed. No pertinent family history.    Review of  Systems: All other systems reviewed and are otherwise negative except as noted above.  Physical Exam: Vitals:   01/21/17 1755 01/21/17 2205 01/22/17 0124 01/22/17 0516  BP: (!) 142/85 (!) 148/85 (!) 141/81 138/86  Pulse: (!) 59 66 (!) 58 (!) 52  Resp: 18 20 20 18   Temp: 98 F (36.7 C) 97.9 F (36.6 C) 98 F (36.7 C) 97.6 F (36.4 C)  TempSrc: Oral Oral Oral Tympanic  SpO2: 92% 97% 100% 99%  Weight:      Height:        GEN- The patient is well appearing, alert and oriented x 3 today.   Head- normocephalic, atraumatic Eyes-  Sclera clear, conjunctiva pink Ears- slightly hard of hearing Oropharynx- clear Neck- supple Lungs- CTA b/l, normal work of breathing Heart- RRR, no murmurs, rubs or gallops  GI- soft, NT, ND Extremities- no clubbing, cyanosis, or edema MS- no significant deformity or atrophy Skin- no rash or lesion Psych- euthymic mood, full affect   Labs:   Lab Results  Component Value Date   WBC 8.6 01/22/2017   HGB 14.2 01/22/2017   HCT 42.8 01/22/2017   MCV 98.2 01/22/2017   PLT 255 01/22/2017    Recent Labs  Lab 01/22/17 0441  NA 141  K 4.2  CL 107  CO2 22  BUN 19  CREATININE 1.20  CALCIUM 8.7*  PROT 6.2*  BILITOT 0.8  ALKPHOS 43  ALT 13*  AST 19  GLUCOSE 99   No results found for: CKTOTAL, CKMB, CKMBINDEX, TROPONINI Lab Results  Component Value Date   CHOL 167 01/20/2017   Lab Results  Component Value Date   HDL 37 (L) 01/20/2017   Lab Results  Component Value Date   LDLCALC 107 (H) 01/20/2017   Lab Results  Component Value Date   TRIG 115 01/20/2017   Lab Results  Component Value Date   CHOLHDL 4.5 01/20/2017   No results found for: LDLDIRECT  No results found for: DDIMER   Radiology/Studies:   Ct Angio Head W Or Wo Contrast Result Date: 01/19/2017 CLINICAL DATA:  Stroke, ataxia EXAM: CT ANGIOGRAPHY HEAD AND NECK TECHNIQUE: Multidetector CT imaging of the head and neck was performed using the standard protocol during  bolus administration of intravenous contrast. Multiplanar CT image reconstructions and MIPs were obtained to evaluate the vascular anatomy. Carotid stenosis measurements (when applicable) are obtained utilizing NASCET criteria, using the distal internal carotid diameter as the denominator. CONTRAST:  > 50 mL ISOVUE-370 IOPAMIDOL (ISOVUE-370) INJECTION 76% COMPARISON:  CT head 01/19/2017 FINDINGS: CTA NECK FINDINGS Aortic arch: Mild atherosclerotic disease aortic arch without aneurysm or dissection. Proximal great vessels widely patent. Right carotid system: Mild atherosclerotic calcification proximal right internal carotid artery without significant stenosis. Left carotid system: Mild atherosclerotic calcification proximal left internal carotid artery without significant stenosis. Vertebral arteries: Right vertebral artery dominant. Both vertebral arteries are patent to the basilar without significant stenosis Skeleton: Cervical spondylosis without acute skeletal abnormality.  Other neck: Negative for mass or adenopathy. Upper chest: Negative Review of the MIP images confirms the above findings CTA HEAD FINDINGS Anterior circulation: Cavernous carotid widely patent bilaterally. Anterior and middle cerebral arteries widely patent without stenosis Posterior circulation: Right vertebral artery dominant. Fusiform dilatation of the distal right vertebral artery proximal to the basilar. 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 PICA patent bilaterally. Basilar normal in caliber and widely patent. Severe stenosis mid right posterior cerebral artery. Mild stenosis left posterior cerebral artery. Posterior communicating artery patent bilaterally. Venous sinuses: Patent Anatomic variants: None Delayed phase: Normal enhancement. Review of the MIP images confirms the above findings IMPRESSION: Mild atherosclerotic disease the carotid bifurcation bilaterally without stenosis. No significant vertebral artery stenosis. 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 Mild stenosis proximal left posterior cerebral artery. Anterior and middle cerebral arteries widely patent. Negative for emergent large vessel occlusion. Electronically Signed   By: Marlan Palau M.D.   On: 01/19/2017 18:37    Dg Chest 2 View Result Date: 01/20/2017 CLINICAL DATA:  Shortness of breath.  MRI clearance. EXAM: CHEST  2 VIEW COMPARISON:  None. FINDINGS: The heart size and mediastinal contours are within normal limits. Both lungs are clear. The visualized skeletal structures are unremarkable. No metallic object. IMPRESSION: No active cardiopulmonary disease.  No metallic foreign body. Electronically Signed   By: Deatra Robinson M.D.   On: 01/20/2017 03:50      Mr Brain Wo Contrast Result Date: 01/20/2017 CLINICAL DATA:  Unsteady gait, left facial droop and left-sided weakness EXAM: MRI HEAD WITHOUT CONTRAST MRA HEAD WITHOUT CONTRAST TECHNIQUE: Multiplanar, multiecho pulse sequences of the brain and surrounding structures were obtained without intravenous contrast. Angiographic images of the head were obtained using MRA technique without contrast. COMPARISON:  CTA head neck 01/19/2017 FINDINGS: MRI HEAD FINDINGS Brain: The midline structures are normal. There is focal diffusion restriction within the anteromedial right thalamus, measuring approximately 1.6 cm. There is mild periventricular leukoaraiosis, most commonly seen in the setting of chronic ischemic microangiopathy. No mass lesion. No chronic microhemorrhage or cerebral amyloid angiopathy. No hydrocephalus, age advanced atrophy or lobar predominant volume loss. No dural abnormality or extra-axial collection. Skull and upper cervical spine: The visualized skull base, calvarium, upper cervical spine and extracranial soft tissues are normal. Sinuses/Orbits: No fluid levels or advanced mucosal thickening. No mastoid effusion.  Normal orbits. MRA HEAD FINDINGS Intracranial internal carotid arteries: Normal. Anterior cerebral arteries: Normal. Middle cerebral arteries: Normal. Posterior communicating arteries: Present bilaterally. Posterior cerebral arteries: Bilateral fetal origins. Severe stenosis of the proximal right P2 segment. Moderate stenosis of the left P1 segment. Basilar artery: Normal. Vertebral arteries: Right-dominant. Fusiform dilatation of the right vertebral artery. Superior cerebellar arteries: Normal. Anterior inferior cerebellar arteries: Not clearly visualized, but this is not uncommon. Posterior inferior cerebellar arteries: Normal. IMPRESSION: 1. Small acute ischemic infarct of the anteromedial right thalamus without hemorrhage or mass effect. 2. Bilateral posterior cerebral artery stenosis, severe in the proximal right P2 segment and moderate in the mid left P1 segment. Electronically Signed   By: Deatra Robinson M.D.   On: 01/20/2017 03:44    Ct Head Code Stroke Wo Contrast Addendum Date: 01/19/2017   ADDENDUM REPORT: 01/19/2017 15:46 ADDENDUM: These results were called by telephone at the time of interpretation on 01/19/2017 at 3:46 pm to Dr. Otelia Limes, who verbally acknowledged these results. Electronically Signed   By: Marlan Palau M.D.   On: 01/19/2017 15:46  Result  Date: 01/19/2017 CLINICAL DATA:  Code stroke. Focal neuro deficit greater than 6 hours. Left-sided weakness and facial droop EXAM: CT HEAD WITHOUT CONTRAST TECHNIQUE: Contiguous axial images were obtained from the base of the skull through the vertex without intravenous contrast. COMPARISON:  None. FINDINGS: Brain: Ventricle size and cerebral volume normal for age. Negative for acute infarct, hemorrhage, or mass. No fluid collection or shift of the midline structures. Vascular: Negative for hyperdense vessel Skull: Negative Sinuses/Orbits: Mild mucosal edema paranasal sinuses. Right cataract removal. No orbital mass. Other: None ASPECTS ([place]  Stroke Program Early CT Score) - Ganglionic level infarction (caudate, lentiform nuclei, internal capsule, insula, M1-M3 cortex): 7 - Supraganglionic infarction (M4-M6 cortex): 3 Total score (0-10 with 10 being normal): 10 IMPRESSION: 1. No acute abnormality 2. ASPECTS is 10 3. I paged the stroke neurologist at this time. Electronically Signed: By: Marlan Palau M.D. On: 01/19/2017 14:59    12-lead ECG SB, RBBB All prior EKG's in EPIC reviewed with no documented atrial fibrillation  Telemetry SB/SR  Assessment and Plan:  1. Cryptogenic stroke The patient presents with cryptogenic stroke.  The patient has a TEE planned for this afternoon.  I spoke at length with the patient about monitoring for afib with either a 30 day event monitor or an implantable loop recorder.  Risks, benefits, and alteratives to implantable loop recorder were discussed with the patient today.   At this time, the patient does not want to proceed with ILR, he is very uncomfortable with the idea despite lengthy conversation and discussion with his sister at bedside as well.  He would like to follow up out patient with neurology, and have 30 day monitor arranged as first step.     Addend: After further thought the patient has decided to pursue loop implant.  Will await TEE and implant if negative   Sheilah Pigeon, PA-C 01/22/2017   EP Attending  Patient seen and examined. Agree with above. He has had a cryptogenic stroke. He was noted to have a PFO but doppler of the lower extremities were negative. He will proceed with ILR insertion. I have discussed the indications with the patient and he wishes to proceed.  Leonia Reeves.D.

## 2017-01-22 NOTE — Progress Notes (Signed)
  Echocardiogram 2D Echocardiogram has been performed.  Ridhi Hoffert T Jeannifer Drakeford 01/22/2017, 12:25 PM

## 2017-01-22 NOTE — Progress Notes (Signed)
  Echocardiogram 2D Echocardiogram has been performed.  Victor Mcclain 01/22/2017, 12:26 PM

## 2017-01-22 NOTE — Progress Notes (Signed)
Physical Therapy Treatment Patient Details Name: Victor Mcclain MRN: [id number] DOB: Feb 27, 1940 Today's Date: 01/22/2017    History of Present Illness 77 y.o. male with medical history significant of previously treated HTN (no longer on medications), Hx of BPH s/p what seems like a TURP, Hx of Renal Failure a few years ago requiring Hospitalization at [facility], and other comorbids who presented to [facility] ED with a cc of Unsteady Gait/Ataxia, Falling to the left side, with left sided weakness and Left facial droop. 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    PT Comments    Patient received in bed, pleasant and willing to participate in skilled PT services today. He is Mod(I) for all bed mobility but does continue to require Min guard for functional transfers and gait; note gait mechanics and navigation are improving, patient seems more aware of tendency to move to his L however does require VC to assist with obstacle navigation and moving through small/tight spaces due to L drift. He was left up in the chair with chair alarm set and wife present, all needs otherwise met this morning.     Follow Up Recommendations  Outpatient PT;Supervision/Assistance - 24 hour     Equipment Recommendations  Rolling walker with 5" wheels;3in1 (PT)    Recommendations for Other Services       Precautions / Restrictions Precautions Precautions: Fall Precaution Comments: pt mildly impulsive this session Restrictions Weight Bearing Restrictions: No    Mobility  Bed Mobility Overal bed mobility: Modified Independent Bed Mobility: Supine to Sit           General bed mobility comments: extended time   Transfers Overall transfer level: Needs assistance Equipment used: None Transfers: Sit to/from Stand Sit to Stand: Min guard         General transfer comment: min guard for  safety  Ambulation/Gait Ambulation/Gait assistance: Min guard Ambulation Distance (Feet): 400 Feet Assistive device: None Gait Pattern/deviations: Step-through pattern Gait velocity: normal   General Gait Details: good sequencing, improved awareness of tendency to move to the L during gait, requires min VC for obstacle navigation on the L, drift to L increases as fatigue increases    Stairs            Wheelchair Mobility    Modified Rankin (Stroke Patients Only)       Balance Overall balance assessment: Needs assistance Sitting-balance support: No upper extremity supported;Feet supported Sitting balance-Leahy Scale: Good     Standing balance support: During functional activity;No upper extremity supported Standing balance-Leahy Scale: Fair Standing balance comment: continues to have L drift/tendency to move to L                             Cognition Arousal/Alertness: Awake/alert Behavior During Therapy: WFL for tasks assessed/performed Overall Cognitive Status: Within Functional Limits for tasks assessed                           Safety/Judgement: Decreased awareness of safety;Decreased awareness of deficits   Problem Solving: Requires verbal cues General Comments: patient demonstrates improved awareness of deficits but continues to require VC for safety       Exercises      General Comments        Pertinent Vitals/Pain Pain Assessment: No/denies pain    Home Living  Prior Function            PT Goals (current goals can now be found in the care plan section) Acute Rehab PT Goals Patient Stated Goal: Go home soon PT Goal Formulation: With patient Time For Goal Achievement: 01/27/17 Potential to Achieve Goals: Good Progress towards PT goals: Progressing toward goals    Frequency    Min 4X/week      PT Plan Current plan remains appropriate    Co-evaluation              AM-PAC  PT "6 Clicks" Daily Activity  Outcome Measure  Difficulty turning over in bed (including adjusting bedclothes, sheets and blankets)?: None Difficulty moving from lying on back to sitting on the side of the bed? : None Difficulty sitting down on and standing up from a chair with arms (e.g., wheelchair, bedside commode, etc,.)?: None Help needed moving to and from a bed to chair (including a wheelchair)?: A Little Help needed walking in hospital room?: A Little Help needed climbing 3-5 steps with a railing? : A Little 6 Click Score: 21    End of Session Equipment Utilized During Treatment: Gait belt Activity Tolerance: Patient tolerated treatment well Patient left: in chair;with call bell/phone within reach;with family/visitor present;with chair alarm set   PT Visit Diagnosis: Unsteadiness on feet (R26.81);Other abnormalities of gait and mobility (R26.89);Other symptoms and signs involving the nervous system (R29.898);Hemiplegia and hemiparesis Hemiplegia - Right/Left: Left Hemiplegia - dominant/non-dominant: Non-dominant Hemiplegia - caused by: Cerebral infarction     Time: 6943-7005 PT Time Calculation (min) (ACUTE ONLY): 13 min  Charges:  $Gait Training: 8-22 mins                    G Codes:       Deniece Ree PT, DPT, CBIS  Supplemental Physical Therapist [place]   Pager [phone]

## 2017-01-22 NOTE — Discharge Instructions (Signed)
Implant site care °Keep incision clean and dry for 3 days.  °You can remove outer dressing tomorrow. °Leave steri-strips (little pieces of tape) on until seen in the office for wound check appointment. °Call the office ([phone]) for redness, drainage, swelling, or fever. ° °

## 2017-01-22 NOTE — Progress Notes (Signed)
Late entry: I was able to discuss wound care instructions with the patient prior to his loop implant procedure.  Francis Dowseenee Danna Sewell, PA-C

## 2017-01-22 NOTE — Transfer of Care (Signed)
Immediate Anesthesia Transfer of Care Note  Patient: Victor Mcclain  Procedure(s) Performed: TRANSESOPHAGEAL ECHOCARDIOGRAM (TEE) (N/A )  Patient Location: Endoscopy Unit  Anesthesia Type:MAC  Level of Consciousness: drowsy  Airway & Oxygen Therapy: Patient Spontanous Breathing and Patient connected to nasal cannula oxygen  Post-op Assessment: Report given to RN and Post -op Vital signs reviewed and stable  Post vital signs: Reviewed and stable  Last Vitals:  Vitals:   01/22/17 1121 01/22/17 1218  BP: (!) 191/93 (!) 132/57  Pulse: (!) 51 (!) 58  Resp: 14 13  Temp: 36.5 C 36.6 C  SpO2: 98% 94%    Last Pain:  Vitals:   01/22/17 1218  TempSrc: Oral  PainSc:          Complications: No apparent anesthesia complications

## 2017-01-22 NOTE — Anesthesia Procedure Notes (Signed)
Procedure Name: MAC Date/Time: 01/22/2017 11:54 AM Performed by: Candis Shine, CRNA Pre-anesthesia Checklist: Patient identified, Emergency Drugs available, Suction available, Patient being monitored and Timeout performed Patient Re-evaluated:Patient Re-evaluated prior to induction Oxygen Delivery Method: Nasal cannula Dental Injury: Teeth and Oropharynx as per pre-operative assessment

## 2017-01-22 NOTE — Interval H&P Note (Signed)
History and Physical Interval Note:  01/22/2017 11:56 AM  Victor Mcclain  has presented today for surgery, with the diagnosis of stroke  The various methods of treatment have been discussed with the patient and family. After consideration of risks, benefits and other options for treatment, the patient has consented to  Procedure(s): TRANSESOPHAGEAL ECHOCARDIOGRAM (TEE) (N/A) as a surgical intervention .  The patient's history has been reviewed, patient examined, no change in status, stable for surgery.  I have reviewed the patient's chart and labs.  Questions were answered to the patient's satisfaction.     Armanda Magicraci Maurissa Ambrose

## 2017-01-22 NOTE — Interval H&P Note (Signed)
History and Physical Interval Note:  01/22/2017 10:56 AM  Lum KeasJetty Earna Coderuttle  has presented today for surgery, with the diagnosis of stroke  The various methods of treatment have been discussed with the patient and family. After consideration of risks, benefits and other options for treatment, the patient has consented to  Procedure(s): TRANSESOPHAGEAL ECHOCARDIOGRAM (TEE) (N/A) as a surgical intervention .  The patient's history has been reviewed, patient examined, no change in status, stable for surgery.  I have reviewed the patient's chart and labs.  Questions were answered to the patient's satisfaction.     Armanda Magicraci Kristina Mcnorton

## 2017-01-22 NOTE — Care Management Note (Addendum)
Case Management Note  Patient Details  Name: Victor Mcclain MRN: [id number] Date of Birth: Jun 29, 1940  Subjective/Objective:     Pt admitted with a stroke. He is from home with his spouse.             PCP:   Dr Watt ClimesEscajeda in [place]  Action/Plan: CM consulted for outpatient therapy. Pt would like [facility]. CM attempted to arrange today but rehab closed d/t the holiday.  CM provided the orders to the patient. CM will [facility] tomorrow when facility is open. Pt has 24/7 support and transportation home.  Addendum: 01/24/2017 at 1240pm:: [facility] received the orders for outpatient and have accepted the patient. CM spoke to patients wife and they plan on starting the rehab next week.   Expected Discharge Date:                  Expected Discharge Plan:  OP Rehab  In-House Referral:     Discharge planning Services  CM Consult  Post Acute Care Choice:    Choice offered to:     DME Arranged:    DME Agency:     HH Arranged:    HH Agency:     Status of Service:  Completed, signed off  If discussed at [employer] Length of Stay Meetings, dates discussed:    Additional Comments:  Kermit BaloKelli F Mohid Furuya, RN 01/22/2017, 3:49 PM

## 2017-01-22 NOTE — Progress Notes (Signed)
Discharge instructions, RXs and follow up appts explained and provided to patient and c/g verbalized understanding. Patient left floor via wheelchair accompanied by staff no c/o pain pr shortness of breath at d/c.  Zyrus Hetland, Kae HellerMiranda Lynn, RN

## 2017-01-22 NOTE — Progress Notes (Signed)
STROKE TEAM PROGRESS NOTE   HISTORY OF PRESENT ILLNESS (per record) Victor Mcclain is an 77 y.o. male presenting via EMS from home with new onset of dysarthria, left facial droop and left arm weakness. LKN was 9:00 PM on Thursday. He also endorsed leaning towards the left with ambulation and speech "slightly off on some words"since this morning. His wife noticed that the patient was not himself this morning. He states that he is not on ASA or Plavix. He is not on a blood thinner.   LSN: 9:00 PM on Thursday tPA Given: No: Out of time window    SUBJECTIVE (INTERVAL HISTORY) No family members present. The patient was seen in TEE   OBJECTIVE Temp:  [97.6 F (36.4 C)-98 F (36.7 C)] 97.8 F (36.6 C) (01/21 1218) Pulse Rate:  [51-69] 54 (01/21 1240) Cardiac Rhythm: Sinus bradycardia (01/21 0700) Resp:  [10-20] 10 (01/21 1240) BP: (132-191)/(57-93) 148/74 (01/21 1240) SpO2:  [92 %-100 %] 96 % (01/21 1240)  CBC:  Recent Labs  Lab 01/21/17 0616 01/22/17 0441  WBC 8.3 8.6  NEUTROABS 5.2 5.5  HGB 14.7 14.2  HCT 44.1 42.8  MCV 97.8 98.2  PLT 245 255    Basic Metabolic Panel:  Recent Labs  Lab 01/21/17 0616 01/22/17 0441  NA 140 141  K 3.8 4.2  CL 104 107  CO2 27 22  GLUCOSE 109* 99  BUN 18 19  CREATININE 1.34* 1.20  CALCIUM 8.8* 8.7*  MG 2.2 2.1  PHOS 3.2 3.6    Lipid Panel:     Component Value Date/Time   CHOL 167 01/20/2017 0411   TRIG 115 01/20/2017 0411   HDL 37 (L) 01/20/2017 0411   CHOLHDL 4.5 01/20/2017 0411   VLDL 23 01/20/2017 0411   LDLCALC 107 (H) 01/20/2017 0411   HgbA1c:  Lab Results  Component Value Date   HGBA1C 5.4 01/20/2017   Urine Drug Screen:     Component Value Date/Time   LABOPIA NONE DETECTED 01/19/2017 1513   COCAINSCRNUR NONE DETECTED 01/19/2017 1513   LABBENZ NONE DETECTED 01/19/2017 1513   AMPHETMU NONE DETECTED 01/19/2017 1513   THCU NONE DETECTED 01/19/2017 1513   LABBARB NONE DETECTED 01/19/2017 1513    Alcohol Level      Component Value Date/Time   ETH <10 01/19/2017 1435    IMAGING   Ct Angio Head W Or Wo Contrast Ct Angio Neck W Or Wo Contrast 01/19/2017 IMPRESSION:  Mild atherosclerotic disease the carotid bifurcation bilaterally without stenosis.  No significant vertebral artery stenosis.  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  Severe stenosis right posterior cerebral artery.  Mild stenosis proximal left posterior cerebral artery.  Anterior and middle cerebral arteries widely patent.  Negative for emergent large vessel occlusion.    Dg Chest 2 View 01/20/2017 IMPRESSION:  No active cardiopulmonary disease.  No metallic foreign body.    Mr Maxine GlennMra Head Wo Contrast 01/20/2017 IMPRESSION:  1. Small acute ischemic infarct of the anteromedial right thalamus without hemorrhage or mass effect.  2. Bilateral posterior cerebral artery stenosis, severe in the proximal right P2 segment and moderate in the mid left P1 segment.    Ct Head Code Stroke Wo Contrast 01/19/2017 IMPRESSION:  1. No acute abnormality  2. ASPECTS is 10 3. I paged the stroke neurologist at this time.     Transthoracic Echocardiogram -  Left ventricle: The cavity size was normal. Wall thickness was   increased in a pattern of mild LVH. Systolic  function was normal.   The estimated ejection fraction was in the range of 60% to 65%.   Wall motion was normal; there were no regional wall motion   abnormalities   Bilateral Carotid Dopplers  01/20/2017 Final Interpretation: Right Carotid: Velocities in the right ICA are consistent with a 1-39% stenosis. Left Carotid: Velocities in the left ICA are consistent with a 1-39% stenosis. Vertebrals: Both vertebral arteries were patent with antegrade flow.  TEE : no clot . PFO present  PHYSICAL EXAM Vitals:   01/22/17 1218 01/22/17 1220 01/22/17 1230 01/22/17 1240  BP: (!) 132/57 (!) 132/57 (!) 146/72 (!) 148/74  Pulse: (!) 58 (!) 58 64 (!) 54   Resp: 13 14 17 10   Temp: 97.8 F (36.6 C)     TempSrc: Oral     SpO2: 94% 94% 93% 96%  Weight:      Height:      Neurological Exam :  Awake alert oriented x 3 normal speech and language. Mild left lower face asymmetry. Tongue midline. No drift. Mild diminished fine finger movements on left. Orbits right over left upper extremity. Mild left grip weak.. Normal sensation . Normal coordination.    ASSESSMENT/PLAN [name] is a 77 y.o. male with no significant past medical history presenting with dysarthria and left-sided weakness. He did not receive IV t-PA due to late presentation.  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 Wall thickness was   increased in a pattern of mild LVH. Systolic function was normal.   The estimated ejection fraction was in the range of 60% to 65%.   Wall motion was normal; there were no regional wall motion    abnormalities  LDL - 107  HgbA1c - 5.4  VTE prophylaxis - Lovenox Fall precautions Seizure precautions Aspiration precautions Diet NPO time specified Except for: Sips with Meds  No antithrombotic prior to admission, now on aspirin 325 mg daily  Patient counseled to be compliant with his antithrombotic medications  Ongoing aggressive stroke risk factor management  Therapy recommendations:  Outpatient PT and OT recommended.  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   Severe stenosis right posterior cerebral artery.    Plan / Recommendations   Check LE venous dopplers and if negative for DVT do  Outpatient PT and OT  Follow-up with Dr. Pearlean Brownie in 6 weeks  Continue aspirin and statin at discharge      Hospital day # 2      Greater than 50% time during this study 25 minute visit  was spent in counseling and coordination of care about his embolic stroke and discussing further workup and answering questions. D/w Dr Mayford Knife and [facility]  Delia Heady, MD Medical Director [facility] Pager: [phone] 01/22/2017 1:32 PM   To contact Stroke Continuity provider, please refer to [email or web address]. After hours, contact General Neurology

## 2017-01-23 ENCOUNTER — Encounter (HOSPITAL_COMMUNITY): Payer: Self-pay | Admitting: Internal Medicine

## 2017-01-23 ENCOUNTER — Telehealth: Payer: Self-pay | Admitting: Internal Medicine

## 2017-01-23 NOTE — Telephone Encounter (Signed)
New message     Augusto GambleJerri - Nurse Navigator calling for instructions/ directions are caring for wound prior to appt. Please call

## 2017-01-23 NOTE — Telephone Encounter (Signed)
Spoke with Victor Mcclain informed her that the clear tegaderm dressing could come off tomorrow but the steri-strips should remain in place until wound check. Informed Victor Mcclain that if site does start bleeding again they could hold pressure for 10min. Victor Mcclain stated that she would inform pt and wife.

## 2017-02-05 ENCOUNTER — Ambulatory Visit (INDEPENDENT_AMBULATORY_CARE_PROVIDER_SITE_OTHER): Payer: Self-pay | Admitting: *Deleted

## 2017-02-05 DIAGNOSIS — I639 Cerebral infarction, unspecified: Secondary | ICD-10-CM

## 2017-02-05 LAB — CUP PACEART INCLINIC DEVICE CHECK
Date Time Interrogation Session: 20190204161014
MDC IDC PG IMPLANT DT: 20190121

## 2017-02-05 NOTE — Progress Notes (Signed)
LINQ wound check. LINQ implanted for cryptogenic stroke. R waves 0.472mV, battery: good. No episodes. Carelink disconnected- patient's wife educated about sending a manual transmission and calling back with any difficulty. Monthly Summary Reports and f/u with Dr. Ladona Ridgelaylor PRN.

## 2017-02-22 ENCOUNTER — Ambulatory Visit (INDEPENDENT_AMBULATORY_CARE_PROVIDER_SITE_OTHER): Payer: Medicare HMO | Admitting: *Deleted

## 2017-02-22 ENCOUNTER — Telehealth: Payer: Self-pay | Admitting: Neurology

## 2017-02-22 ENCOUNTER — Other Ambulatory Visit: Payer: Self-pay

## 2017-02-22 DIAGNOSIS — I639 Cerebral infarction, unspecified: Secondary | ICD-10-CM | POA: Diagnosis not present

## 2017-02-22 MED ORDER — ATORVASTATIN CALCIUM 40 MG PO TABS: 40.0000 mg | ORAL_TABLET | Freq: Every day | ORAL | 2 refills | Status: AC

## 2017-02-22 NOTE — Progress Notes (Signed)
Carelink Summary Report / Loop Recorder 

## 2017-02-22 NOTE — Telephone Encounter (Signed)
Rn call patients wife about aspirin, and lipitor meds. Rn explain per Dr.Sethi her husband should continue both medications until he is seen in the office at his appt in March 2019. Rn stated 325 aspirin can be brought over the counter. A refill can be sent to the pharmacy listed for lipitor until he is seen in the office. Rn stated pt was discharge [facility]. Typically pts are seen 6 weeks. Sometimes pts hospital follow up can go to 8 weeks. Rn stated Dr. Pearlean BrownieSethi rotates between hospital, and clinic every other week. The MD likes for pts to see their other MD and notify them of the new stroke diagnose. Also to continue PT, OT. Dr. Pearlean BrownieSethi he wants to see if patients are progressing with the plan of therapy after 6 to 9 weeks. Rn stated to wife if her husband is getting worse or experiencing new sympts to call the office. The wife verbalized understanding.

## 2017-02-22 NOTE — Telephone Encounter (Signed)
FYI(not call back has been requested) wife(no DPR on file) has called inquiring if pt's 2 medications (aspirin and Lipitor) are medications he needs to continue, RN advised that wife needs to be informed the aspirin can be purchased over the counter and do not need prescription for that.  It was reinterated several times to continue taking both medications.  Wife will call in from time to time to see if pt can get in to see Dr Pearlean BrownieSethi before 03-27. Wife stated she sent the prescription to  [facility] [address] - [place], [place] - [id number] [phone] (Phone) [phone] (Fax)   And is waiting to hear back from them because pt is down to 1 pill.

## 2017-03-09 ENCOUNTER — Other Ambulatory Visit: Payer: Self-pay

## 2017-03-09 ENCOUNTER — Encounter (HOSPITAL_COMMUNITY): Payer: Self-pay | Admitting: Emergency Medicine

## 2017-03-09 ENCOUNTER — Emergency Department (HOSPITAL_COMMUNITY): Payer: Medicare HMO

## 2017-03-09 ENCOUNTER — Inpatient Hospital Stay (HOSPITAL_COMMUNITY)
Admission: EM | Admit: 2017-03-09 | Discharge: 2017-03-12 | DRG: 194 | Disposition: A | Discharge status: Home / Self Care | Payer: Medicare HMO | Attending: Family Medicine | Admitting: Family Medicine

## 2017-03-09 DIAGNOSIS — Z823 Family history of stroke: Secondary | ICD-10-CM | POA: Diagnosis not present

## 2017-03-09 DIAGNOSIS — Z801 Family history of malignant neoplasm of trachea, bronchus and lung: Secondary | ICD-10-CM

## 2017-03-09 DIAGNOSIS — J918 Pleural effusion in other conditions classified elsewhere: Secondary | ICD-10-CM | POA: Diagnosis present

## 2017-03-09 DIAGNOSIS — N182 Chronic kidney disease, stage 2 (mild): Secondary | ICD-10-CM | POA: Diagnosis present

## 2017-03-09 DIAGNOSIS — I129 Hypertensive chronic kidney disease with stage 1 through stage 4 chronic kidney disease, or unspecified chronic kidney disease: Secondary | ICD-10-CM | POA: Diagnosis present

## 2017-03-09 DIAGNOSIS — F17220 Nicotine dependence, chewing tobacco, uncomplicated: Secondary | ICD-10-CM | POA: Diagnosis present

## 2017-03-09 DIAGNOSIS — I480 Paroxysmal atrial fibrillation: Secondary | ICD-10-CM | POA: Diagnosis not present

## 2017-03-09 DIAGNOSIS — I4891 Unspecified atrial fibrillation: Secondary | ICD-10-CM | POA: Diagnosis present

## 2017-03-09 DIAGNOSIS — I4510 Unspecified right bundle-branch block: Secondary | ICD-10-CM | POA: Diagnosis present

## 2017-03-09 DIAGNOSIS — R0902 Hypoxemia: Secondary | ICD-10-CM | POA: Diagnosis present

## 2017-03-09 DIAGNOSIS — J189 Pneumonia, unspecified organism: Secondary | ICD-10-CM | POA: Diagnosis not present

## 2017-03-09 DIAGNOSIS — Y95 Nosocomial condition: Secondary | ICD-10-CM | POA: Diagnosis present

## 2017-03-09 DIAGNOSIS — I517 Cardiomegaly: Secondary | ICD-10-CM | POA: Diagnosis present

## 2017-03-09 DIAGNOSIS — Z7982 Long term (current) use of aspirin: Secondary | ICD-10-CM | POA: Diagnosis not present

## 2017-03-09 DIAGNOSIS — E785 Hyperlipidemia, unspecified: Secondary | ICD-10-CM | POA: Diagnosis present

## 2017-03-09 DIAGNOSIS — I69311 Memory deficit following cerebral infarction: Secondary | ICD-10-CM | POA: Diagnosis not present

## 2017-03-09 DIAGNOSIS — R0781 Pleurodynia: Secondary | ICD-10-CM | POA: Diagnosis present

## 2017-03-09 DIAGNOSIS — E872 Acidosis: Secondary | ICD-10-CM | POA: Diagnosis present

## 2017-03-09 DIAGNOSIS — I69320 Aphasia following cerebral infarction: Secondary | ICD-10-CM | POA: Diagnosis not present

## 2017-03-09 LAB — CBC
HEMATOCRIT: 43.3 % (ref 39.0–52.0)
Hemoglobin: 14.8 g/dL (ref 13.0–17.0)
MCH: 33.2 pg (ref 26.0–34.0)
MCHC: 34.2 g/dL (ref 30.0–36.0)
MCV: 97.1 fL (ref 78.0–100.0)
PLATELETS: 250 10*3/uL (ref 150–400)
RBC: 4.46 MIL/uL (ref 4.22–5.81)
RDW: 12.2 % (ref 11.5–15.5)
WBC: 15.1 10*3/uL — AB (ref 4.0–10.5)

## 2017-03-09 LAB — INFLUENZA PANEL BY PCR (TYPE A & B)
INFLBPCR: NEGATIVE
Influenza A By PCR: NEGATIVE

## 2017-03-09 LAB — I-STAT CG4 LACTIC ACID, ED
LACTIC ACID, VENOUS: 2.04 mmol/L — AB (ref 0.5–1.9)
Lactic Acid, Venous: 1.99 mmol/L — ABNORMAL HIGH (ref 0.5–1.9)

## 2017-03-09 LAB — BASIC METABOLIC PANEL
Anion gap: 11 (ref 5–15)
BUN: 18 mg/dL (ref 6–20)
CALCIUM: 9.1 mg/dL (ref 8.9–10.3)
CO2: 23 mmol/L (ref 22–32)
Chloride: 102 mmol/L (ref 101–111)
Creatinine, Ser: 1.23 mg/dL (ref 0.61–1.24)
GFR calc Af Amer: >60 mL/min (ref >60)
GFR, EST NON AFRICAN AMERICAN: 56 mL/min — AB (ref >60)
GLUCOSE: 102 mg/dL — AB (ref 65–99)
POTASSIUM: 4.3 mmol/L (ref 3.5–5.1)
Sodium: 136 mmol/L (ref 135–145)

## 2017-03-09 LAB — TROPONIN I

## 2017-03-09 LAB — LACTIC ACID, PLASMA: LACTIC ACID, VENOUS: 1.7 mmol/L (ref 0.5–1.9)

## 2017-03-09 LAB — I-STAT TROPONIN, ED: TROPONIN I, POC: 0 ng/mL (ref 0.00–0.08)

## 2017-03-09 MED ORDER — ASPIRIN EC 325 MG PO TBEC
325.0000 mg | DELAYED_RELEASE_TABLET | Freq: Every day | ORAL | Status: DC
  Administered 2017-03-10: 325 mg via ORAL
  Filled 2017-03-09: qty 1

## 2017-03-09 MED ORDER — ATORVASTATIN CALCIUM 40 MG PO TABS
40.0000 mg | ORAL_TABLET | Freq: Every day | ORAL | Status: DC
  Administered 2017-03-09 – 2017-03-11 (×3): 40 mg via ORAL
  Filled 2017-03-09 (×3): qty 1

## 2017-03-09 MED ORDER — MORPHINE SULFATE (PF) 4 MG/ML IV SOLN
4.0000 mg | Freq: Once | INTRAVENOUS | Status: AC
  Administered 2017-03-09: 4 mg via INTRAVENOUS
  Filled 2017-03-09: qty 1

## 2017-03-09 MED ORDER — SODIUM CHLORIDE 0.9 % IV SOLN
1.0000 g | Freq: Three times a day (TID) | INTRAVENOUS | Status: DC
  Administered 2017-03-09 – 2017-03-11 (×6): 1 g via INTRAVENOUS
  Filled 2017-03-09 (×7): qty 1

## 2017-03-09 MED ORDER — SODIUM CHLORIDE 0.9 % IV BOLUS (SEPSIS)
1000.0000 mL | Freq: Once | INTRAVENOUS | Status: AC
  Administered 2017-03-09: 1000 mL via INTRAVENOUS

## 2017-03-09 MED ORDER — VANCOMYCIN HCL IN DEXTROSE 1-5 GM/200ML-% IV SOLN
1000.0000 mg | Freq: Two times a day (BID) | INTRAVENOUS | Status: DC
  Administered 2017-03-09 – 2017-03-11 (×4): 1000 mg via INTRAVENOUS
  Filled 2017-03-09 (×5): qty 200

## 2017-03-09 MED ORDER — VANCOMYCIN HCL 10 G IV SOLR
1500.0000 mg | Freq: Once | INTRAVENOUS | Status: AC
  Administered 2017-03-09: 1500 mg via INTRAVENOUS
  Filled 2017-03-09: qty 1500

## 2017-03-09 MED ORDER — ENOXAPARIN SODIUM 40 MG/0.4ML ~~LOC~~ SOLN
40.0000 mg | SUBCUTANEOUS | Status: DC
  Administered 2017-03-09: 40 mg via SUBCUTANEOUS
  Filled 2017-03-09: qty 0.4

## 2017-03-09 MED ORDER — ORAL CARE MOUTH RINSE
15.0000 mL | Freq: Two times a day (BID) | OROMUCOSAL | Status: DC
  Administered 2017-03-09 – 2017-03-12 (×5): 15 mL via OROMUCOSAL

## 2017-03-09 MED ORDER — ACETAMINOPHEN 650 MG RE SUPP: 650.0000 mg | Freq: Four times a day (QID) | RECTAL | Status: DC | PRN

## 2017-03-09 MED ORDER — SODIUM CHLORIDE 0.9% FLUSH
3.0000 mL | Freq: Two times a day (BID) | INTRAVENOUS | Status: DC
  Administered 2017-03-09 – 2017-03-12 (×6): 3 mL via INTRAVENOUS

## 2017-03-09 MED ORDER — CEFEPIME HCL 2 G IJ SOLR
2.0000 g | Freq: Once | INTRAMUSCULAR | Status: AC
  Administered 2017-03-09: 2 g via INTRAVENOUS
  Filled 2017-03-09: qty 2

## 2017-03-09 MED ORDER — ACETAMINOPHEN 325 MG PO TABS: 650.0000 mg | ORAL_TABLET | Freq: Four times a day (QID) | ORAL | Status: DC | PRN

## 2017-03-09 MED ORDER — IOPAMIDOL (ISOVUE-370) INJECTION 76%
INTRAVENOUS | Status: AC
  Administered 2017-03-09: 100 mL
  Filled 2017-03-09: qty 100

## 2017-03-09 NOTE — Progress Notes (Signed)
Pharmacy Antibiotic Note  Kevan NyJetty Hannum is a 77 y.o. male admitted on 03/09/2017 with pneumonia.  Pharmacy has been consulted for cefepime and vancomycin dosing. WBC 15.1, afebrile, CXR with bibasilar opacities  Plan: Vancomycin 1500 mg IV x1 then 1000mg  IV every 12 hours.  Goal trough 15-20 mcg/mL. Zosyn 3.375g IV q8h (4 hour infusion). Monitor clinical progression and LOT  Height: 6\' 1"  (185.4 cm) Weight: 218 lb (98.9 kg) IBW/kg (Calculated) : 79.9  Temp (24hrs), Avg:99.8 F (37.7 C), Min:99.8 F (37.7 C), Max:99.8 F (37.7 C)  Recent Labs  Lab 03/09/17 1311  WBC 15.1*  CREATININE 1.23    Estimated Creatinine Clearance: 65.2 mL/min (by C-G formula based on SCr of 1.23 mg/dL).    Allergies  Allergen Reactions  . Novocain [Procaine] Anaphylaxis    And any similar meds    Thank you for allowing pharmacy to be a part of this patient's care.  Toniann Failony L Levy Wellman 03/09/2017 2:13 PM

## 2017-03-09 NOTE — ED Notes (Signed)
Pt O2 89% on RA, MD aware, pt placed on 2L.

## 2017-03-09 NOTE — H&P (Addendum)
Family Medicine Teaching [facility] Admission History and Physical Service Pager: [phone]  Patient name: Victor Mcclain Medical record number: [id number] Date of birth: [birth year] Age: 77 y.o. Gender: male  Primary Care Provider: System, Pcp Not In Consultants: n/a Code Status: full  Chief Complaint: SOB  Assessment and Plan: Victor Mcclain is a 77 y.o. male presenting with SOB x 2days  . PMH is significant for ischemic stroke in Jan '19, HTN.  HCAP w/ left pleural effusion: Patient w/ 2 days SOB. Coughing for past four weeks productive of sputum. Hypoxic to upper 80s on RA. Stable O2 saturations with 2L O2 by [place]. Patient with tachypnea to mid 20s. Afebrile. Leukocytosis to 15 and minimal lactic acidosis to 2.04. CXR and CTA show multifocal pneumonia and left sided pleural effusion.  CTA obtained due to concern for PE given pleuritic chest pain, sudden SOB, and hypoxia. CTA negative for PE. Suspect PNA superimposed on bronchitis given long history of cough with sudden worsening of respiratory status.  -admit to med-surg, Dr. Jennette Kettle -telemetry with continuous pulse ox -vancomycin (3/8- ) and cefepime (3/8- ) for HAP given admission less than 90 days ago  -O2 supplementation as needed -incentive spirometry  -f/u blood cultures    Left sided chest pain: reproducible to firm palpation on exam, pleuritic and worse with severe coughing.  Non radiating.  Likely costocondritis/muscular.  ECG was neg for stemi.  CXR showed cardiomegaly but no acute cardiac process.  Initial troponin neg in ED. -trend troponins -repeat EKG in AM   Hx of ischemic stroke: with minimal left sided deficits and some slowing of cognitive processing.   Patient now with loop recorder to investigate possibilty of Afib as embolic source.  Prescribed Aspirin 325 mg and lipitor 40 mg.  Neuro check consistent with patient's stroke recovery, no new deficits -home asprin 325 mg -home lipitor 40 mg  HTN: on diagnosis list,  patient not currently on meds. 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 Cr 1.2 at admission appears consistent with baseline.  -will avoid nephrotoxic medications.  Aortic athersclerosis: indicated on CT -will note in d/c summary for pcp  FEN/GI: heart healthy diet Prophylaxis: lovenox Patient has living will but specifically stated he wishes to be FULL CODE  Disposition: to home when cleared  History of Present Illness:  Victor Mcclain is a 77 y.o. male presenting with worsening SOB x 2days after being moderately ill for ~1wk. Reports chronic cough for past 4 weeks. Productive of sputum. He says it has slowly gotten worse and the pain with coughing is his most concerning feature.  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 Endorses chills that started today. He was seen on Tuesday for these symtpoms and prescribed azithromycin.  Recent hx includes an ischemic stroke in Jan '19.   He has a loop recorder to investigate a chance of Afib being the embolic source.   His only other known medical hx is HTN for which he does not take any medications.  Review Of Systems: Per HPI with the following additions:   Review of Systems  Constitutional: Positive for chills. Negative for fever and malaise/fatigue.  HENT: Negative for congestion.   Respiratory: Positive for cough, sputum production and shortness of breath. Negative for hemoptysis and wheezing.   Cardiovascular: Negative for palpitations and leg swelling.  Gastrointestinal: Negative for abdominal pain, diarrhea and vomiting.  Neurological: Negative for  weakness.    Patient Active Problem List   Diagnosis Date Noted  . HCAP (healthcare-associated pneumonia) 03/09/2017  . 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 Hyperlipidemia 01/20/2017  . Bradycardia 01/19/2017   . History of hypertension 01/19/2017  . History of BPH 01/19/2017  . Former smoker 01/19/2017    Past Medical History: Past Medical History:  Diagnosis Date  . Cataract   . 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 Esophageal stretching    . LOOP RECORDER INSERTION N/A 01/22/2017   Procedure: LOOP RECORDER INSERTION;  Surgeon: Marinus Mawaylor, Gregg W, MD;  [facility];  Service: Cardiovascular;  Laterality: N/A;  . TEE WITHOUT CARDIOVERSION N/A 01/22/2017   Procedure: TRANSESOPHAGEAL ECHOCARDIOGRAM (TEE);  Surgeon: Quintella Reicherturner, Traci R, MD;  [facility];  Service: Cardiovascular;  Laterality: N/A;    Social History: Social History   Tobacco Use  . Smoking status: Former Smoker    Packs/day: 3.00    Types: Cigarettes    Last attempt to quit: 01/19/1986    Years since quitting: 31.1  . Smokeless tobacco: Current User    Types: Chew  Substance Use Topics  . Alcohol use: No    Frequency: Never  . Drug use: No   Additional social history:   Please also refer to relevant sections of EMR.  Family History: Family History  Problem Relation Age of Onset  . Transient ischemic attack Mother   . Stroke Mother   . Cancer Father        lung  . Cerebral aneurysm Sister   . Cancer Brother        lung    Allergies and Medications: Allergies  Allergen Reactions  . Novocain [Procaine] Anaphylaxis    And any similar meds  . Other Anaphylaxis    Any med ending in -caine  . Tape Other (See Comments)    Patient prefers paper tape  . Vancomycin Other (See Comments)    IV caused redness and burning in the arm; at site   No current facility-administered medications on file prior to encounter.    Current Outpatient Medications on File Prior to Encounter  Medication Sig Dispense Refill  . acetaminophen (TYLENOL) 500 MG tablet Take 500-1,000 mg by mouth every 6 (six)  hours as needed (for pain or headaches).     Marland Kitchen. aspirin 325 MG tablet Take 1 tablet (325 mg total) by mouth daily. 30 tablet 0  . atorvastatin (LIPITOR) 40 MG tablet Take 1 tablet (40 mg total) by mouth daily at 6 PM. 30 tablet 2  . azithromycin (ZITHROMAX) 250 MG tablet Take 250-500 mg by mouth See admin instructions. Take 500 mg by mouth once a day on day one then 250 mg once a day on days 2-5    . glucosamine-chondroitin 500-400 MG tablet Take 1 tablet by mouth daily.    . Multiple Vitamin (MULTIVITAMIN) tablet Take 1 tablet by mouth daily.      Objective: BP (!) 141/94   Pulse 89   Temp 99.8 F (37.7 C) (Oral)   Resp (!) 24   Ht 6\' 1"  (1.854 m)   Wt 218 lb (98.9 kg)   SpO2 96%   BMI 28.76 kg/m  Exam: General: uncomfortable but not toxic appearing Eyes: clear sclera EOMI, PERRLA ENTM: oropharynx clear, MMM, no rhinorrhea  Neck: no lesions, no ROM restriction Cardiovascular: RRR, 2/6 murmur Respiratory: decreased breath sounds at bases, mild IWB, on [place], no wheezes Gastrointestinal: no belly pain, no masses, soft  MSK: 5/5 strength bilaterally but noted assymetry with left side weaker. Derm: no lesions/rashes Neuro: CN exam normal with exception of asymetric weakness on Left Psych: slight delay in cognitive processing that patient and sister say is consistent since stroke, AOx3  Labs and Imaging: CBC BMET  Recent Labs  Lab 03/09/17 1311  WBC 15.1*  HGB 14.8  HCT 43.3  PLT 250   Recent Labs  Lab 03/09/17 1311  NA 136  K 4.3  CL 102  CO2 23  BUN 18  CREATININE 1.23  GLUCOSE 102*  CALCIUM 9.1     Dg Chest 2 View  Result Date: 03/09/2017 CLINICAL DATA:  LEFT chest pain. EXAM: CHEST - 2 VIEW COMPARISON:  01/20/2017 chest radiographs FINDINGS: Mild cardiomegaly noted. A loop recorder is present. Bibasilar opacities are identified-question atelectasis versus airspace disease. There is no evidence of pulmonary edema, mass, pleural effusion or pneumothorax. No acute bony  abnormalities are identified. IMPRESSION: Bibasilar opacities-question atelectasis versus airspace disease/pneumonia. Electronically Signed   By: Harmon Pier M.D.   On: 03/09/2017 13:21   Ct Angio Chest Pe W And/or Wo Contrast  Result Date: 03/09/2017 CLINICAL DATA:  Left lower chest pain, worse with deep breathing. Cough. Recent stroke. Loop recorder in place. EXAM: CT ANGIOGRAPHY CHEST WITH CONTRAST TECHNIQUE: Multidetector CT imaging of the chest was performed using the standard protocol during bolus administration of intravenous contrast. Multiplanar CT image reconstructions and MIPs were obtained to evaluate the vascular anatomy. CONTRAST:  ISOVUE-370 IOPAMIDOL (ISOVUE-370) INJECTION 76% COMPARISON:  None. FINDINGS: Cardiovascular: Cardiomegaly. Coronary artery calcifications. Small pericardial effusion. Mild aortic atherosclerosis. No thoracic aortic aneurysm or evidence of aortic dissection. Some of the most peripheral segmental and subsegmental pulmonary artery branches are difficult to definitively characterize due to patient breathing motion artifact, however, there is no pulmonary embolism identified within the main, lobar or central segmental pulmonary arteries bilaterally. Mediastinum/Nodes: No mass or enlarged lymph nodes within the mediastinum or perihilar regions. Esophagus appears normal. Trachea and central bronchi are unremarkable. Lungs/Pleura: Small left pleural effusion. Small consolidations at the left lung base are likely associated atelectasis. Minimal atelectasis within the dependent aspects of the right lung. Mild emphysematous change at the lung apices. Upper Abdomen: No acute abnormality. Musculoskeletal: Degenerative spurring throughout the kyphotic thoracic spine, mild to moderate in degree. No acute or suspicious osseous finding. Review of the MIP images confirms the above findings. IMPRESSION: 1. No pulmonary embolism, with mild study limitations detailed above. 2.  Cardiomegaly. Small pericardial effusion. Coronary artery calcifications. Recommend correlation with any possible associated cardiac symptoms. 3. Small left pleural effusion. Small consolidations at the adjacent left lung base are most suggestive of associated atelectasis. Additional minimal atelectasis within the right lung. 4. Mild emphysematous change at the lung apices. 5. Mild aortic atherosclerosis.  No aneurysm or dissection. Aortic Atherosclerosis (ICD10-I70.0) and Emphysema (ICD10-J43.9). Electronically Signed   By: Bary Richard M.D.   On: 03/09/2017 16:21    Marthenia Rolling, DO 03/09/2017, 6:37 PM PGY-1, [facility] FPTS Intern pager: [phone], text pages welcome   Upper Level Addendum:  I have seen and evaluated this patient along with Dr. Parke Simmers and reviewed the above note, making necessary revisions in green.   Marcy Siren, D.O. 03/09/2017, 8:04 PM PGY-3, [facility]

## 2017-03-09 NOTE — ED Triage Notes (Signed)
Pt to ER for evaluation of left lower chest pain, worse with deep breathing. States recent stroke in January, has had loop recorder in place since. Wife reports cough. Patient reports the pain began yesterday while participating in physical therapy. Pt inNAD.

## 2017-03-09 NOTE — ED Provider Notes (Signed)
[facility] EMERGENCY DEPARTMENT Provider Note   CSN: [id number] Arrival date & time: 03/09/17  1242     History   Chief Complaint Chief Complaint  Patient presents with  . Chest Pain    HPI Victor Mcclain is a 77 y.o. male history of diabetes here presenting with shortness of breath, cough, subjective chills.  Patient has been having some cough for the last several weeks.  Cough got worse over the last month and he went to see his doctor and had a chest x-ray 2 days ago that was negative.  He was started on Z-Pak for bronchitis.  Since yesterday he began having pleuritic chest pain and subjective chills and subjective fevers.  He states that he was unable to participate in physical therapy due to the pain yesterday.  Also has some left-sided chest tightness.  Denies any contact with anybody who had the flu.  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  He is currently on full dose aspirin.  The history is provided by the patient.    Past Medical History:  Diagnosis Date  . Cataract   . 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 Hyperlipidemia 01/20/2017  . Bradycardia 01/19/2017  . History of hypertension 01/19/2017  . History of BPH 01/19/2017  . Former smoker 01/19/2017    Past Surgical History:  Procedure Laterality Date  . Esophageal stretching    . LOOP RECORDER INSERTION N/A 01/22/2017   Procedure: LOOP RECORDER INSERTION;  Surgeon: Marinus Maw, MD;  Location: [facility] INVASIVE CV LAB;  Service: Cardiovascular;  Laterality: N/A;  . TEE WITHOUT CARDIOVERSION N/A 01/22/2017   Procedure: TRANSESOPHAGEAL ECHOCARDIOGRAM (TEE);  Surgeon: Quintella Reichert, MD;  Location: [facility] ENDOSCOPY;  Service: Cardiovascular;  Laterality:  N/A;       Home Medications    Prior to Admission medications   Medication Sig Start Date End Date Taking? Authorizing Provider  acetaminophen (TYLENOL) 500 MG tablet Take 1,000 mg by mouth every 6 (six) hours as needed.    [provider]  aspirin 325 MG tablet Take 1 tablet (325 mg total) by mouth daily. 01/23/17   Marguerita Merles Latif, DO  atorvastatin (LIPITOR) 40 MG tablet Take 1 tablet (40 mg total) by mouth daily at 6 PM. 02/22/17   Micki Riley, MD  glucosamine-chondroitin 500-400 MG tablet Take 1 tablet by mouth daily.    [provider]  Multiple Vitamin (MULTIVITAMIN) tablet Take 1 tablet by mouth daily.    [provider]    Family History Family History  Problem Relation Age of Onset  . Transient ischemic attack Mother   . Stroke Mother   . Cancer Father        lung  . Cerebral aneurysm Sister   . Cancer Brother        lung    Social History Social History   Tobacco Use  . Smoking status: Former Smoker    Packs/day: 3.00    Types: Cigarettes    Last attempt to quit: 01/19/1986    Years since quitting: 31.1  . Smokeless tobacco: Current User    Types: Chew  Substance Use Topics  . Alcohol use: No    Frequency: Never  . Drug  use: No     Allergies   Novocain [procaine]   Review of Systems Review of Systems  Constitutional: Positive for chills.  Respiratory: Positive for shortness of breath.   Cardiovascular: Positive for chest pain.  All other systems reviewed and are negative.    Physical Exam Updated Vital Signs BP (!) 156/87   Pulse 88   Temp 99.8 F (37.7 C) (Oral)   Resp (!) 27   Ht 6\' 1"  (1.854 m)   Wt 98.9 kg (218 lb)   SpO2 93%   BMI 28.76 kg/m   Physical Exam  Constitutional: He is oriented to person, place, and time.  Chronically ill   HENT:  Head: Normocephalic.  Eyes: EOM are normal. Pupils are equal, round, and reactive to light.  Neck: Normal range of motion. Neck supple.  Cardiovascular:  Regular rhythm and normal pulses.  Pulmonary/Chest:  Crackles bilateral bases, slightly tachypneic   Abdominal: Soft. Bowel sounds are normal.  Musculoskeletal: Normal range of motion.       Right lower leg: Normal.       Left lower leg: Normal.  Neurological: He is alert and oriented to person, place, and time.  Skin: Skin is warm.  Psychiatric: He has a normal mood and affect. His behavior is normal.  Nursing note and vitals reviewed.    ED Treatments / Results  Labs (all labs ordered are listed, but only abnormal results are displayed) Labs Reviewed  BASIC METABOLIC PANEL - Abnormal; Notable for the following components:      Result Value   Glucose, Bld 102 (*)    GFR calc non Af Amer 56 (*)    All other components within normal limits  CBC - Abnormal; Notable for the following components:   WBC 15.1 (*)    All other components within normal limits  I-STAT CG4 LACTIC ACID, ED - Abnormal; Notable for the following components:   Lactic Acid, Venous 2.04 (*)    All other components within normal limits  I-STAT CG4 LACTIC ACID, ED - Abnormal; Notable for the following components:   Lactic Acid, Venous 1.99 (*)    All other components within normal limits  CULTURE, BLOOD (ROUTINE X 2)  CULTURE, BLOOD (ROUTINE X 2)  INFLUENZA PANEL BY PCR (TYPE A & B)  I-STAT TROPONIN, ED    EKG  EKG Interpretation  Date/Time:  Friday March 09 2017 12:49:17 EST Ventricular Rate:  91 PR Interval:  162 QRS Duration: 154 QT Interval:  386 QTC Calculation: 474 R Axis:   -86 Text Interpretation:  Normal sinus rhythm Left axis deviation Right bundle branch block Anterior infarct , age undetermined Abnormal ECG nonspecific changes since previous  Confirmed by Richardean Canal ([id number]) on 03/09/2017 4:04:51 PM       Radiology Dg Chest 2 View  Result Date: 03/09/2017 CLINICAL DATA:  LEFT chest pain. EXAM: CHEST - 2 VIEW COMPARISON:  01/20/2017 chest radiographs FINDINGS: Mild cardiomegaly noted. A  loop recorder is present. Bibasilar opacities are identified-question atelectasis versus airspace disease. There is no evidence of pulmonary edema, mass, pleural effusion or pneumothorax. No acute bony abnormalities are identified. IMPRESSION: Bibasilar opacities-question atelectasis versus airspace disease/pneumonia. Electronically Signed   By: Harmon Pier M.D.   On: 03/09/2017 13:21   Ct Angio Chest Pe W And/or Wo Contrast  Result Date: 03/09/2017 CLINICAL DATA:  Left lower chest pain, worse with deep breathing. Cough. Recent stroke. Loop recorder in place. EXAM: CT ANGIOGRAPHY CHEST WITH CONTRAST TECHNIQUE: Multidetector  CT imaging of the chest was performed using the standard protocol during bolus administration of intravenous contrast. Multiplanar CT image reconstructions and MIPs were obtained to evaluate the vascular anatomy. CONTRAST:  100mL ISOVUE-370 IOPAMIDOL (ISOVUE-370) INJECTION 76% COMPARISON:  None. FINDINGS: Cardiovascular: Cardiomegaly. Coronary artery calcifications. Small pericardial effusion. Mild aortic atherosclerosis. No thoracic aortic aneurysm or evidence of aortic dissection. Some of the most peripheral segmental and subsegmental pulmonary artery branches are difficult to definitively characterize due to patient breathing motion artifact, however, there is no pulmonary embolism identified within the main, lobar or central segmental pulmonary arteries bilaterally. Mediastinum/Nodes: No mass or enlarged lymph nodes within the mediastinum or perihilar regions. Esophagus appears normal. Trachea and central bronchi are unremarkable. Lungs/Pleura: Small left pleural effusion. Small consolidations at the left lung base are likely associated atelectasis. Minimal atelectasis within the dependent aspects of the right lung. Mild emphysematous change at the lung apices. Upper Abdomen: No acute abnormality. Musculoskeletal: Degenerative spurring throughout the kyphotic thoracic spine, mild to  moderate in degree. No acute or suspicious osseous finding. Review of the MIP images confirms the above findings. IMPRESSION: 1. No pulmonary embolism, with mild study limitations detailed above. 2. Cardiomegaly. Small pericardial effusion. Coronary artery calcifications. Recommend correlation with any possible associated cardiac symptoms. 3. Small left pleural effusion. Small consolidations at the adjacent left lung base are most suggestive of associated atelectasis. Additional minimal atelectasis within the right lung. 4. Mild emphysematous change at the lung apices. 5. Mild aortic atherosclerosis.  No aneurysm or dissection. Aortic Atherosclerosis (ICD10-I70.0) and Emphysema (ICD10-J43.9). Electronically Signed   By: Bary RichardStan  Maynard M.D.   On: 03/09/2017 16:21    Procedures Procedures (including critical care time)  Medications Ordered in ED Medications  vancomycin (VANCOCIN) IVPB 1000 mg/200 mL premix (not administered)  ceFEPIme (MAXIPIME) 1 g in sodium chloride 0.9 % 100 mL IVPB (not administered)  morphine 4 MG/ML injection 4 mg (4 mg Intravenous Given 03/09/17 1554)  sodium chloride 0.9 % bolus 1,000 mL (0 mLs Intravenous Stopped 03/09/17 1507)  ceFEPIme (MAXIPIME) 2 g in sodium chloride 0.9 % 100 mL IVPB (0 g Intravenous Stopped 03/09/17 1553)  vancomycin (VANCOCIN) 1,500 mg in sodium chloride 0.9 % 500 mL IVPB (0 mg Intravenous Stopped 03/09/17 1508)  iopamidol (ISOVUE-370) 76 % injection (100 mLs  Contrast Given 03/09/17 1603)  sodium chloride 0.9 % bolus 1,000 mL (1,000 mLs Intravenous New Bag/Given 03/09/17 1623)     Initial Impression / Assessment and Plan / ED Course  I have reviewed the triage vital signs and the nursing notes.  Pertinent labs & imaging results that were available during my care of the patient were reviewed by me and considered in my medical decision making (see chart for details).    Kevan NyJetty Faas is a 77 y.o. male here with cough, fever, pleuritic chest pain. Consider PE  vs flu vs pneumonia. Will do sepsis workup with CBC, CMP, lactate, CXR, cultures, flu. Will get CTA chest to r/o PE.   4:32 PM WBC 15. CXR showed bilateral pneumonia. CTA showed pneumonia, lactate 2. Given vanc/cefepime. His O2 dropped to 89% on RA. He meets SIRS criteria. Will admit for HCAP.    Final Clinical Impressions(s) / ED Diagnoses   Final diagnoses:  None    ED Discharge Orders    None       Charlynne PanderYao, David Hsienta, MD 03/09/17 1635

## 2017-03-09 NOTE — ED Notes (Signed)
Attempted to call report x 1  

## 2017-03-09 NOTE — ED Notes (Signed)
Attempted to call report x2

## 2017-03-10 ENCOUNTER — Encounter (HOSPITAL_COMMUNITY): Payer: Self-pay | Admitting: Cardiology

## 2017-03-10 DIAGNOSIS — J189 Pneumonia, unspecified organism: Principal | ICD-10-CM

## 2017-03-10 DIAGNOSIS — I480 Paroxysmal atrial fibrillation: Secondary | ICD-10-CM

## 2017-03-10 LAB — CBC
HEMATOCRIT: 36.8 % — AB (ref 39.0–52.0)
HEMOGLOBIN: 12.2 g/dL — AB (ref 13.0–17.0)
MCH: 32.5 pg (ref 26.0–34.0)
MCHC: 33.2 g/dL (ref 30.0–36.0)
MCV: 98.1 fL (ref 78.0–100.0)
Platelets: 251 10*3/uL (ref 150–400)
RBC: 3.75 MIL/uL — ABNORMAL LOW (ref 4.22–5.81)
RDW: 12.7 % (ref 11.5–15.5)
WBC: 11.8 10*3/uL — ABNORMAL HIGH (ref 4.0–10.5)

## 2017-03-10 LAB — BASIC METABOLIC PANEL
ANION GAP: 10 (ref 5–15)
BUN: 15 mg/dL (ref 6–20)
CHLORIDE: 102 mmol/L (ref 101–111)
CO2: 24 mmol/L (ref 22–32)
Calcium: 8.3 mg/dL — ABNORMAL LOW (ref 8.9–10.3)
Creatinine, Ser: 1.15 mg/dL (ref 0.61–1.24)
GFR calc Af Amer: >60 mL/min (ref >60)
GLUCOSE: 98 mg/dL (ref 65–99)
POTASSIUM: 3.8 mmol/L (ref 3.5–5.1)
Sodium: 136 mmol/L (ref 135–145)

## 2017-03-10 LAB — TROPONIN I
Troponin I: <0.03 ng/mL (ref <0.03)
Troponin I: <0.03 ng/mL (ref <0.03)

## 2017-03-10 LAB — LACTIC ACID, PLASMA: LACTIC ACID, VENOUS: 0.7 mmol/L (ref 0.5–1.9)

## 2017-03-10 LAB — HEPARIN LEVEL (UNFRACTIONATED)

## 2017-03-10 MED ORDER — APIXABAN 5 MG PO TABS
5.0000 mg | ORAL_TABLET | Freq: Two times a day (BID) | ORAL | Status: DC
  Administered 2017-03-10 – 2017-03-12 (×5): 5 mg via ORAL
  Filled 2017-03-10 (×5): qty 1

## 2017-03-10 MED ORDER — LABETALOL HCL 5 MG/ML IV SOLN
10.0000 mg | Freq: Once | INTRAVENOUS | Status: AC
  Administered 2017-03-10: 10 mg via INTRAVENOUS
  Filled 2017-03-10: qty 4

## 2017-03-10 MED ORDER — PROCHLORPERAZINE MALEATE 5 MG PO TABS
5.0000 mg | ORAL_TABLET | Freq: Four times a day (QID) | ORAL | Status: DC | PRN
  Administered 2017-03-10: 5 mg via ORAL
  Filled 2017-03-10 (×2): qty 1

## 2017-03-10 MED ORDER — PROMETHAZINE HCL 25 MG/ML IJ SOLN
12.5000 mg | Freq: Once | INTRAMUSCULAR | Status: AC | PRN
  Administered 2017-03-10: 12.5 mg via INTRAVENOUS
  Filled 2017-03-10: qty 1

## 2017-03-10 MED ORDER — DILTIAZEM HCL 60 MG PO TABS
30.0000 mg | ORAL_TABLET | Freq: Three times a day (TID) | ORAL | Status: DC
  Administered 2017-03-10 – 2017-03-12 (×6): 30 mg via ORAL
  Filled 2017-03-10 (×6): qty 1

## 2017-03-10 MED ORDER — HEPARIN (PORCINE) IN NACL 100-0.45 UNIT/ML-% IJ SOLN
1200.0000 [IU]/h | INTRAMUSCULAR | Status: DC
  Administered 2017-03-10: 1200 [IU]/h via INTRAVENOUS
  Filled 2017-03-10: qty 250

## 2017-03-10 MED ORDER — SODIUM CHLORIDE 0.9 % IV BOLUS (SEPSIS)
500.0000 mL | Freq: Once | INTRAVENOUS | Status: AC
  Administered 2017-03-10: 500 mL via INTRAVENOUS

## 2017-03-10 NOTE — Progress Notes (Signed)
ANTICOAGULATION CONSULT NOTE - Initial Consult  Pharmacy Consult for heparin Indication: atrial fibrillation  Allergies  Allergen Reactions  . Novocain [Procaine] Anaphylaxis    And any similar meds  . Other Anaphylaxis    Any med ending in -caine  . Tape Other (See Comments)    Patient prefers paper tape  . Vancomycin Other (See Comments)    IV caused redness and burning in the arm; at site    Patient Measurements: Height: 6\' 1"  (185.4 cm) Weight: 217 lb 12.8 oz (98.8 kg) IBW/kg (Calculated) : 79.9  Vital Signs: Temp: 98.8 F (37.1 C) (03/09 0408) Temp Source: Oral (03/09 0408) BP: 127/84 (03/09 0408) Pulse Rate: 87 (03/09 0408)  Labs: Recent Labs    03/09/17 1311 03/09/17 1830 03/09/17 2341 03/10/17 0518  HGB 14.8  --   --  12.2*  HCT 43.3  --   --  36.8*  PLT 250  --   --  251  CREATININE 1.23  --   --  1.15  TROPONINI  --  <0.03 <0.03 <0.03    Estimated Creatinine Clearance: 69.7 mL/min (by C-G formula based on SCr of 1.15 mg/dL).   Medical History: Past Medical History:  Diagnosis Date  . Cataract   . 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 acetaminophen (TYLENOL) 500 MG tablet Take 500-1,000 mg by mouth every 6 (six) hours as needed (for pain or headaches).    [facility]  . aspirin 325 MG tablet Take 1 tablet (325 mg total) by mouth daily. 30 tablet 0 03/09/2017 at 1020  . atorvastatin (LIPITOR) 40 MG tablet Take 1 tablet (40 mg total) by mouth daily at 6 PM. 30 tablet 2 03/08/2017 at pm  . azithromycin (ZITHROMAX) 250 MG tablet Take 250-500 mg by mouth See admin instructions. Take 500 mg by mouth once a day on day one then 250 mg once a day on days 2-5   03/09/2017 at am  . glucosamine-chondroitin 500-400 MG tablet Take 1 tablet by mouth daily.   03/09/2017 at Unknown time  . Multiple Vitamin (MULTIVITAMIN) tablet Take 1 tablet by mouth daily.    03/09/2017 at Unknown time   Scheduled:  . aspirin EC  325 mg Oral Daily  . atorvastatin  40 mg Oral q1800  . labetalol  10 mg Intravenous Once  . mouth rinse  15 mL Mouth Rinse BID  . sodium chloride flush  3 mL Intravenous Q12H   Infusions:  . ceFEPime (MAXIPIME) IV 1 g (03/10/17 [id number])  . vancomycin Stopped (03/09/17 2325)    Assessment: 77yo male c/o worsening SOB x2d, recent h/o ischemic stroke Jan 2019, now w/ loop recorder to investigate Afib as possible source for stroke, this am central tele reports that pt was having SVT w. Sustained HR in 130s to 150s, now to start heparin gtt.  Goal of Therapy:  Heparin level 0.3-0.7 units/ml Monitor platelets by anticoagulation protocol: Yes   Plan:  Rec'd Lovenox 40mg  SQ at 2200; will start heparin gtt at 1200 units/hr and monitor heparin levels and CBC.  Vernard GamblesVeronda Reginald Weida, PharmD, BCPS  03/10/2017,7:55 AM

## 2017-03-10 NOTE — Progress Notes (Signed)
Family Medicine Teaching Service Daily Progress Note Intern Pager: [phone]  Patient name: Victor Mcclain Medical record number: [id number] Date of birth: 1940/06/24 Age: 77 y.o. Gender: male  Primary Care Provider: System, Pcp Not In Consultants: Cardiology  Code Status: FULL   Pt Overview and Major Events to Date:  3/8: Admitted for HAP with hypoxia; started on Vancomycin and Cefepime   Assessment and Plan:  Victor Mcclain is a 77 y.o. male presenting with SOB x 2days  . PMH is significant for ischemic stroke in Jan '19, HTN.  HCAP w/ left pleural effusion: Now with normal respiratory rate. Patient able to be weaned to RA overnight.  Lactic acidosis has resolved and leukocytosis improved to 11.8 from 15.  -telemetry with continuous pulse ox -vancomycin (3/8- ) and cefepime (3/8- ) for HAP given admission less than 90 days ago; anticipate ability to narrow to oral antibiotics soon  -O2 supplementation as needed -incentive spirometry  -f/u blood cultures   Afib with RVR: Patient with tachycardia to the 140-150s this morning. Initially, telemetry was unsure of rhythm. Patient was asymptomatic. Given presentation for infectious etiology, patient was given additional 500 cc fluid bolus. HR remained elevated. EKG obtained and showed Afib with RVR. Patient given Labetalol 10 mg IV and HR stabilized to 70s. -start heparin gtt especially given recent CVA suspected to be embolic in nature  -cardiology consulted, awaiting recommendations  -if RVR again will attempt another 1x dose of BB for rate control    Left sided chest pain: reproducible to firm palpation on exam, pleuritic and worse with severe coughing.  Non radiating. Likely costocondritis/muscular.  ECG was neg for stemi; repeat unchanged.  CXR showed cardiomegaly but no acute cardiac process. Troponin neg x2  -obtain final troponin   Hx of ischemic stroke: with minimal left sided deficits and some slowing of cognitive processing.    Patient now with loop recorder to investigate possibilty of Afib as embolic source.  Prescribed Aspirin 325 mg and lipitor 40 mg.  Neuro check consistent with patient's stroke recovery, no new deficits -home asprin 325 mg -home lipitor 40 mg  HTN: on diagnosis list, patient not currently on meds. 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 Cr 1.2 at admission appears consistent with baseline.  -will avoid nephrotoxic medications.  Aortic athersclerosis: indicated on CT -will note in d/c summary for pcp  FEN/GI: heart healthy diet Prophylaxis: lovenox  Disposition: Pending cardiology recommendations   Subjective:  Reports feeling well this morning. No chest pain or sensation of heart racing. Talking about woes of aging.   Objective: Temp:  [98.8 F (37.1 C)-99.8 F (37.7 C)] 98.8 F (37.1 C) (03/09 0408) Pulse Rate:  [61-89] 87 (03/09 0408) Resp:  [18-28] 18 (03/09 0408) BP: (109-156)/(61-98) 127/84 (03/09 0408) SpO2:  [89 %-98 %] 97 % (03/09 0408) Weight:  [217 lb 12.8 oz (98.8 kg)-218 lb (98.9 kg)] 217 lb 12.8 oz (98.8 kg) (03/08 2026) Physical Exam: General: alert and interactive, NAD  Cardiovascular: irregular rhythm regular rate Respiratory: CTAB with decreased breath sounds in bases bilaterally. Comfortable WOB on RA. No retractions or tachypnea.  Abdomen: soft, NTND  Extremities: No LE edema.   Laboratory: Recent Labs  Lab 03/09/17 1311 03/10/17 0518  WBC 15.1* 11.8*  HGB 14.8 12.2*  HCT 43.3 36.8*  PLT 250 251   Recent Labs  Lab 03/09/17 1311  NA 136  K 4.3  CL 102  CO2 23  BUN 18  CREATININE 1.23  CALCIUM 9.1  GLUCOSE 102*    Imaging/Diagnostic Tests: Dg Chest 2 View  Result Date: 03/09/2017 CLINICAL DATA:  LEFT chest pain. EXAM: CHEST - 2 VIEW COMPARISON:  01/20/2017 chest radiographs FINDINGS: Mild cardiomegaly noted. A loop recorder is present. Bibasilar opacities are identified-question atelectasis versus  airspace disease. There is no evidence of pulmonary edema, mass, pleural effusion or pneumothorax. No acute bony abnormalities are identified. IMPRESSION: Bibasilar opacities-question atelectasis versus airspace disease/pneumonia. Electronically Signed   By: Harmon PierJeffrey  Hu M.D.   On: 03/09/2017 13:21   Ct Angio Chest Pe W And/or Wo Contrast  Result Date: 03/09/2017 CLINICAL DATA:  Left lower chest pain, worse with deep breathing. Cough. Recent stroke. Loop recorder in place. EXAM: CT ANGIOGRAPHY CHEST WITH CONTRAST TECHNIQUE: Multidetector CT imaging of the chest was performed using the standard protocol during bolus administration of intravenous contrast. Multiplanar CT image reconstructions and MIPs were obtained to evaluate the vascular anatomy. CONTRAST:  100mL ISOVUE-370 IOPAMIDOL (ISOVUE-370) INJECTION 76% COMPARISON:  None. FINDINGS: Cardiovascular: Cardiomegaly. Coronary artery calcifications. Small pericardial effusion. Mild aortic atherosclerosis. No thoracic aortic aneurysm or evidence of aortic dissection. Some of the most peripheral segmental and subsegmental pulmonary artery branches are difficult to definitively characterize due to patient breathing motion artifact, however, there is no pulmonary embolism identified within the main, lobar or central segmental pulmonary arteries bilaterally. Mediastinum/Nodes: No mass or enlarged lymph nodes within the mediastinum or perihilar regions. Esophagus appears normal. Trachea and central bronchi are unremarkable. Lungs/Pleura: Small left pleural effusion. Small consolidations at the left lung base are likely associated atelectasis. Minimal atelectasis within the dependent aspects of the right lung. Mild emphysematous change at the lung apices. Upper Abdomen: No acute abnormality. Musculoskeletal: Degenerative spurring throughout the kyphotic thoracic spine, mild to moderate in degree. No acute or suspicious osseous finding. Review of the MIP images confirms  the above findings. IMPRESSION: 1. No pulmonary embolism, with mild study limitations detailed above. 2. Cardiomegaly. Small pericardial effusion. Coronary artery calcifications. Recommend correlation with any possible associated cardiac symptoms. 3. Small left pleural effusion. Small consolidations at the adjacent left lung base are most suggestive of associated atelectasis. Additional minimal atelectasis within the right lung. 4. Mild emphysematous change at the lung apices. 5. Mild aortic atherosclerosis.  No aneurysm or dissection. Aortic Atherosclerosis (ICD10-I70.0) and Emphysema (ICD10-J43.9). Electronically Signed   By: Bary RichardStan  Maynard M.D.   On: 03/09/2017 16:21    Arvilla MarketWallace, Nini Cavan Lauren, DO 03/10/2017, 6:22 AM PGY-3, [facility] FPTS Intern pager: [phone], text pages welcome

## 2017-03-10 NOTE — Consult Note (Signed)
CARDIOLOGY CONSULT NOTE  Patient ID: Victor Mcclain MRN: [id number] DOB/AGE: 75-Dec-1942 75 y.o.  Admit date: 03/09/2017 Primary Physician  None listed Primary Cardiologist Dr. Ladona Ridgel Chief Complaint  Atrial fib Requesting    Dr. Earlene Plater  HPI:  The patient has a history of embolic CVA.  She was seen in Jan for this.  Work up included TEE with PFO, negative venous Doppler for DVT and mild plaque on carotid Doppler.  He had a loop implant.  Came to the ED yesterday with cough and sputum production and presumed pneumonia.  He had some sharp left chest pain.  We are called today because he has developed atrial fib with a rapid rate.  He was treated with fluid and labetalol.    He does not notice the atrial fib.  The patient denies any new symptoms such as chest discomfort, neck or arm discomfort. There has been no new shortness of breath, PND or orthopnea. There have been no reported palpitations, presyncope or syncope.  He has some memory difficulty and aphasia post stroke but not motor deficits.  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      Past Medical History:  Diagnosis Date  . Cataract   . 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 Esophageal stretching    . LOOP RECORDER INSERTION N/A 01/22/2017   Procedure: LOOP RECORDER INSERTION;  Surgeon: Marinus Maw, MD;  Location: [facility] INVASIVE CV LAB;  Service: Cardiovascular;  Laterality: N/A;  . TEE WITHOUT CARDIOVERSION N/A 01/22/2017   Procedure: TRANSESOPHAGEAL ECHOCARDIOGRAM (TEE);  Surgeon: Quintella Reichert, MD;  Location: [facility] ENDOSCOPY;  Service: Cardiovascular;  Laterality: N/A;    Allergies  Allergen Reactions  . Novocain [Procaine] Anaphylaxis    And any similar meds  . Other Anaphylaxis    Any med ending in -caine  . Tape Other (See Comments)    Patient prefers paper tape  . Vancomycin Other (See Comments)    IV caused redness and  burning in the arm; at site   Medications Prior to Admission  Medication Sig Dispense Refill Last Dose  . acetaminophen (TYLENOL) 500 MG tablet Take 500-1,000 mg by mouth every 6 (six) hours as needed (for pain or headaches).    Unk at [facility]  . aspirin 325 MG tablet Take 1 tablet (325 mg total) by mouth daily. 30 tablet 0 03/09/2017 at 1020  . atorvastatin (LIPITOR) 40 MG tablet Take 1 tablet (40 mg total) by mouth daily at 6 PM. 30 tablet 2 03/08/2017 at pm  . azithromycin (ZITHROMAX) 250 MG tablet Take 250-500 mg by mouth See admin instructions. Take 500 mg by mouth once a day on day one then 250 mg once a day on days 2-5   03/09/2017 at am  . glucosamine-chondroitin 500-400 MG tablet Take 1 tablet by mouth daily.   03/09/2017 at Unknown time  . Multiple Vitamin (MULTIVITAMIN) tablet Take 1 tablet by mouth daily.   03/09/2017 at Unknown time   Family History  Problem Relation Age of Onset  . Transient ischemic attack Mother   . Stroke Mother   . Cancer Father        lung  . Cerebral aneurysm Sister   . Cancer Brother        lung    Social History   Socioeconomic History  . Marital status: Married    Spouse name: Not  on file  . Number of children: Not on file  . Years of education: Not on file  . Highest education level: Not on file  Social Needs  . Financial resource strain: Not on file  . Food insecurity - worry: Not on file  . Food insecurity - inability: Not on file  . Transportation needs - medical: Not on file  . Transportation needs - non-medical: Not on file  Occupational History  . Not on file  Tobacco Use  . Smoking status: Former Smoker    Packs/day: 3.00    Types: Cigarettes    Last attempt to quit: 01/19/1986    Years since quitting: 31.1  . Smokeless tobacco: Current User    Types: Chew  Substance and Sexual Activity  . Alcohol use: No    Frequency: Never  . Drug use: No  . Sexual activity: Yes  Other Topics Concern  . Not on file  Social History Narrative  .  Not on file     ROS:    As stated in the HPI and negative for all other systems.  Physical Exam: Blood pressure 95/77, pulse (!) 145, temperature 99.5 F (37.5 C), temperature source Oral, resp. rate 20, height 6\' 1"  (1.854 m), weight 217 lb 12.8 oz (98.8 kg), SpO2 100 %.  GENERAL:  Well appearing NECK:  No jugular venous distention, waveform within normal limits, carotid upstroke brisk and symmetric, no bruits, no thyromegaly LUNGS:  Clear to auscultation bilaterally CHEST:  Unremarkable HEART:  PMI not displaced or sustained,S1 within normal limits, no S3,  no clicks, no rubs, no murmurs ABD:  Flat, positive bowel sounds normal in frequency in pitch, no bruits, no rebound, no guarding, no midline pulsatile mass, no hepatomegaly, no splenomegaly EXT:  2 plus pulses throughout, no edema, no cyanosis no clubbing   Labs: Lab Results  Component Value Date   BUN 15 03/10/2017   Lab Results  Component Value Date   CREATININE 1.15 03/10/2017   Lab Results  Component Value Date   NA 136 03/10/2017   K 3.8 03/10/2017   CL 102 03/10/2017   CO2 24 03/10/2017   Lab Results  Component Value Date   TROPONINI <0.03 03/10/2017   Lab Results  Component Value Date   WBC 11.8 (H) 03/10/2017   HGB 12.2 (L) 03/10/2017   HCT 36.8 (L) 03/10/2017   MCV 98.1 03/10/2017   PLT 251 03/10/2017   Lab Results  Component Value Date   CHOL 167 01/20/2017   HDL 37 (L) 01/20/2017   LDLCALC 107 (H) 01/20/2017   TRIG 115 01/20/2017   CHOLHDL 4.5 01/20/2017   Lab Results  Component Value Date   ALT 13 (L) 01/22/2017   AST 19 01/22/2017   ALKPHOS 43 01/22/2017   BILITOT 0.8 01/22/2017      Radiology:   CXR: Bibasilar opacities-question atelectasis versus airspace disease/pneumonia.  EKG:   Atrial fib, RBBB(chronic), no acute ST T wave changes.   ASSESSMENT AND PLAN:   ATRIAL FIB:   Switch to Eliquis.  Victor Mcclain has a CHA2DS2 - VASc score of 4.  Stop heparin.  Rate control.  I  will start with low dose IR Cardizem and consolidate to CD.   No indication for cardioversion as this is likely PAF but if we cannot rate control and he remains in atrial fib we could consider DCCV before discharge.   EMBOLIC CVA:   Recent history of this obviously related to atrial fib.  HTN:   BP is running low and will be managed in the context of treating his atrial fib.    CKD STAGE II:  Creat OK.  Follow.      SignedRollene Rotunda: Renly Roots 03/10/2017, 2:38 PM

## 2017-03-10 NOTE — Progress Notes (Signed)
Called by central tele and informed pt was having SVT, sustained HR in the 130's to 150's.  On assessment, pt denies any chest pain or SOB.  Pt does complain of some nausea that was previously treated with phenergan IV.  [facility], who ordered a stat 12-lead EKG and a 500mL bolus of normal saline.  [facility] RN Caprice KluverElisa F. Has been fully briefed on situation and new orders.

## 2017-03-10 NOTE — Progress Notes (Signed)
Received call from telemetry that patient was running SVT with rate sustaining in 140s-150s. Labetolol given x 2 and STAT EKGs obtained with results in chart. Patient said he felt "fine" but had some nausea. PRN given. Will continue to monitor.

## 2017-03-11 LAB — CBC
HEMATOCRIT: 35.7 % — AB (ref 39.0–52.0)
HEMOGLOBIN: 11.9 g/dL — AB (ref 13.0–17.0)
MCH: 32.7 pg (ref 26.0–34.0)
MCHC: 33.3 g/dL (ref 30.0–36.0)
MCV: 98.1 fL (ref 78.0–100.0)
Platelets: 264 10*3/uL (ref 150–400)
RBC: 3.64 MIL/uL — ABNORMAL LOW (ref 4.22–5.81)
RDW: 12.7 % (ref 11.5–15.5)
WBC: 9.6 10*3/uL (ref 4.0–10.5)

## 2017-03-11 MED ORDER — LEVOFLOXACIN 750 MG PO TABS
750.0000 mg | ORAL_TABLET | ORAL | Status: DC
  Administered 2017-03-11: 750 mg via ORAL
  Filled 2017-03-11: qty 1

## 2017-03-11 MED ORDER — NYSTATIN 100000 UNIT/GM EX POWD
Freq: Two times a day (BID) | CUTANEOUS | Status: DC
  Filled 2017-03-11: qty 15

## 2017-03-11 NOTE — Progress Notes (Signed)
Progress Note  Patient Name: Victor Mcclain Date of Encounter: 03/11/2017  Primary Cardiologist:   Lewayne BuntingGregg Taylor, MD   Subjective   No complaints.  Wants to go home.    Inpatient Medications    Scheduled Meds: . apixaban  5 mg Oral BID  . atorvastatin  40 mg Oral q1800  . diltiazem  30 mg Oral Q8H  . mouth rinse  15 mL Mouth Rinse BID  . sodium chloride flush  3 mL Intravenous Q12H   Continuous Infusions: . ceFEPime (MAXIPIME) IV 1 g (03/11/17 0624)  . vancomycin 1,000 mg (03/11/17 0928)   PRN Meds: acetaminophen **OR** acetaminophen, prochlorperazine   Vital Signs    Vitals:   03/10/17 2009 03/11/17 0006 03/11/17 0419 03/11/17 0917  BP: 107/74 102/69 101/74 133/83  Pulse: 75 72 69 70  Resp: 17 18 15    Temp: 98.6 F (37 C) 98.6 F (37 C) 98.8 F (37.1 C) 97.9 F (36.6 C)  TempSrc: Oral Oral Oral Oral  SpO2: 97% 95% 94% 93%  Weight:      Height:        Intake/Output Summary (Last 24 hours) at 03/11/2017 1118 Last data filed at 03/11/2017 0354 Gross per 24 hour  Intake 703 ml  Output -  Net 703 ml   Filed Weights   03/09/17 1407 03/09/17 2026  Weight: 218 lb (98.9 kg) 217 lb 12.8 oz (98.8 kg)    Telemetry    NSR - Personally Reviewed  ECG    NA - Personally Reviewed  Physical Exam   GEN: No acute distress.   Neck: No  JVD Cardiac: RRR, no murmurs, rubs, or gallops.  Respiratory: Clear  to auscultation bilaterally. GI: Soft, nontender, non-distended  MS: No  edema; No deformity. Neuro:  Nonfocal  Psych: Normal affect   Labs    Chemistry Recent Labs  Lab 03/09/17 1311 03/10/17 0518  NA 136 136  K 4.3 3.8  CL 102 102  CO2 23 24  GLUCOSE 102* 98  BUN 18 15  CREATININE 1.23 1.15  CALCIUM 9.1 8.3*  GFRNONAA 56* >60  GFRAA >60 >60  ANIONGAP 11 10     Hematology Recent Labs  Lab 03/09/17 1311 03/10/17 0518 03/11/17 0601  WBC 15.1* 11.8* 9.6  RBC 4.46 3.75* 3.64*  HGB 14.8 12.2* 11.9*  HCT 43.3 36.8* 35.7*  MCV 97.1 98.1  98.1  MCH 33.2 32.5 32.7  MCHC 34.2 33.2 33.3  RDW 12.2 12.7 12.7  PLT 250 251 264    Cardiac Enzymes Recent Labs  Lab 03/09/17 1830 03/09/17 2341 03/10/17 0518  TROPONINI <0.03 <0.03 <0.03    Recent Labs  Lab 03/09/17 1317  TROPIPOC 0.00     BNPNo results for input(s): BNP, PROBNP in the last 168 hours.   DDimer No results for input(s): DDIMER in the last 168 hours.   Radiology    Dg Chest 2 View  Result Date: 03/09/2017 CLINICAL DATA:  LEFT chest pain. EXAM: CHEST - 2 VIEW COMPARISON:  01/20/2017 chest radiographs FINDINGS: Mild cardiomegaly noted. A loop recorder is present. Bibasilar opacities are identified-question atelectasis versus airspace disease. There is no evidence of pulmonary edema, mass, pleural effusion or pneumothorax. No acute bony abnormalities are identified. IMPRESSION: Bibasilar opacities-question atelectasis versus airspace disease/pneumonia. Electronically Signed   By: Harmon PierJeffrey  Hu M.D.   On: 03/09/2017 13:21   Ct Angio Chest Pe W And/or Wo Contrast  Result Date: 03/09/2017 CLINICAL DATA:  Left lower chest pain, worse with  deep breathing. Cough. Recent stroke. Loop recorder in place. EXAM: CT ANGIOGRAPHY CHEST WITH CONTRAST TECHNIQUE: Multidetector CT imaging of the chest was performed using the standard protocol during bolus administration of intravenous contrast. Multiplanar CT image reconstructions and MIPs were obtained to evaluate the vascular anatomy. CONTRAST:  ISOVUE-370 IOPAMIDOL (ISOVUE-370) INJECTION 76% COMPARISON:  None. FINDINGS: Cardiovascular: Cardiomegaly. Coronary artery calcifications. Small pericardial effusion. Mild aortic atherosclerosis. No thoracic aortic aneurysm or evidence of aortic dissection. Some of the most peripheral segmental and subsegmental pulmonary artery branches are difficult to definitively characterize due to patient breathing motion artifact, however, there is no pulmonary embolism identified within the main, lobar  or central segmental pulmonary arteries bilaterally. Mediastinum/Nodes: No mass or enlarged lymph nodes within the mediastinum or perihilar regions. Esophagus appears normal. Trachea and central bronchi are unremarkable. Lungs/Pleura: Small left pleural effusion. Small consolidations at the left lung base are likely associated atelectasis. Minimal atelectasis within the dependent aspects of the right lung. Mild emphysematous change at the lung apices. Upper Abdomen: No acute abnormality. Musculoskeletal: Degenerative spurring throughout the kyphotic thoracic spine, mild to moderate in degree. No acute or suspicious osseous finding. Review of the MIP images confirms the above findings. IMPRESSION: 1. No pulmonary embolism, with mild study limitations detailed above. 2. Cardiomegaly. Small pericardial effusion. Coronary artery calcifications. Recommend correlation with any possible associated cardiac symptoms. 3. Small left pleural effusion. Small consolidations at the adjacent left lung base are most suggestive of associated atelectasis. Additional minimal atelectasis within the right lung. 4. Mild emphysematous change at the lung apices. 5. Mild aortic atherosclerosis.  No aneurysm or dissection. Aortic Atherosclerosis (ICD10-I70.0) and Emphysema (ICD10-J43.9). Electronically Signed   By: Bary Richard M.D.   On: 03/09/2017 16:21    Cardiac Studies   NA  Patient Profile     77 y.o. male with a history of embolic CVA.  He was seen in Jan for this.  Work up included TEE with PFO, negative venous Doppler for DVT and mild plaque on carotid Doppler.  He had a loop implant.  Came to the ED with cough and sputum production and presumed pneumonia.  He had some sharp left chest pain.  We were called because he has developed atrial fib with a rapid rate.  He was treated with fluid and labetalol.      Assessment & Plan    ATRIAL FIB:   NSR now.  On Eliquis.  No change in therapy.   EMBOLIC CVA:   Management as  above.   HTN:   BP improved.  No change in therapy.   For questions or updates, please contact [facility] Please consult [email or web address] for contact info under Cardiology/STEMI.   Signed, Rollene Rotunda, MD  03/11/2017, 11:17 AM

## 2017-03-11 NOTE — Progress Notes (Signed)
Pharmacy Antibiotic Note Victor NyJetty Mcclain is a 77 y.o. male admitted on 03/09/2017 with concern for PNA. Initially started on Cefepime and vancomycin but to transition to oral Levaquin for completion of treatment.   Plan: 1. Levaquin 750 mg PO every 24 hours  2. Watch QTc interval while on Levaquin   Height: 6\' 1"  (185.4 cm) Weight: 217 lb 12.8 oz (98.8 kg) IBW/kg (Calculated) : 79.9  Temp (24hrs), Avg:98.5 F (36.9 C), Min:97.7 F (36.5 C), Max:99.2 F (37.3 C)  Recent Labs  Lab 03/09/17 1311 03/09/17 1426 03/09/17 1601 03/09/17 2029 03/09/17 2341 03/10/17 0518 03/11/17 0601  WBC 15.1*  --   --   --   --  11.8* 9.6  CREATININE 1.23  --   --   --   --  1.15  --   LATICACIDVEN  --  2.04* 1.99* 1.7 0.7  --   --      Allergies  Allergen Reactions  . Novocain [Procaine] Anaphylaxis    And any similar meds  . Other Anaphylaxis    Any med ending in -caine  . Tape Other (See Comments)    Patient prefers paper tape  . Vancomycin Other (See Comments)    IV caused redness and burning in the arm; at site    Thank you for allowing pharmacy to be a part of this patient's care.  Pollyann SamplesAndy Sherine Cortese, PharmD, BCPS 03/11/2017, 3:08 PM

## 2017-03-11 NOTE — Progress Notes (Signed)
Family Medicine Teaching Service Daily Progress Note Intern Pager: [phone]  Patient name: Victor Mcclain Medical record number: [id number] Date of birth: [birth year] Age: 77 y.o. Gender: male  Primary Care Provider: System, Pcp Not In Consultants: Cardiology  Code Status: Full  Pt Overview and Major Events to Date:  3/08: Admitted for HAP with hypoxia; started on Vancomycin and Cefepime  3/09: Remains rate controlled on oral Cardizem and switch to Eliquis  Assessment and Plan: Victor NyJetty Luevanos is a 77 y.o. male presenting with SOB x 2days  . PMH is significant for HTN with ischemic stroke in Jan 2019.  HAP with left pleural effusion: Acute.  Improved.  Patient currently on room air without increased work of breathing.  Leukocytosis and lactic acidosis resolved.  Will need to transition from pressure to antibiotics while awaiting final cultures. - Transition vancomycin and cefepime day 3 to oral Levaquin, awaiting blood cultures to finalized - O2 supplementation as needed to keep sats greater than 90%, incentive spirometry  Atrial fibrillation with RVR  h/o ischemic stroke: Patient currently has a loop recorder for possible embolic stroke thought to be from A. fib.  Neurologically, patient remains without neuro deficit.  Remains rate controlled in the 70s on oral Cardizem.  Patient remains asymptomatic. - Cardiology consulted, appreciate recommendations - Transition from heparin drip to Eliquis - Continue home Lipitor 40 mg daily  Noncardiac chest pain: Acute.  Resolved.  Seems to be pleuritic in nature.  Remains without chest pain or shortness of breath.  Troponins have trended negative thus far.  Suspect pain is secondary to pneumonia. - Monitoring for signs of ACS  Hypertension: Per chart review without home antihypertensives.  Remains normotensive. - Follow-up outpatient  Chronic kidney disease stage II: Remains at baseline 1.2. - Avoid nephrotoxic medications  Aortic  arthrosclerosis: Stable finding on CT.  Currently on high intensity statin and anticoagulated. - Follow-up outpatient  FEN/GI: Heart healthy diet Prophylaxis: Eliquis  Disposition: Transition to oral antibiotics and continue to monitor controlled atrial fibrillation with likely discharge in the next 24-48 hours.  Subjective:  Patient denies palpitations, chest pain or shortness of breath.  He is hopeful to be discharged soon.  Otherwise patient has without questions or concerns.  Objective: Temp:  [97.9 F (36.6 C)-99.5 F (37.5 C)] 97.9 F (36.6 C) (03/10 0917) Pulse Rate:  [69-145] 70 (03/10 0917) Resp:  [15-18] 15 (03/10 0419) BP: (95-133)/(59-83) 133/83 (03/10 0917) SpO2:  [93 %-100 %] 93 % (03/10 0917) Physical Exam: General: well appearing, NAD HEENT: normocephalic, atraumatic, moist mucous membranes Neck: supple, non-tender without lymphadenopathy, no JVD Cardiovascular: regular rate and rhythm without murmurs, rubs, or gallops Lungs: clear to auscultation bilaterally with mildly diminished bibasilar breath sounds and normal work of breathing on room air Abdomen: soft, non-tender, non-distended, normoactive bowel sounds Skin: warm, dry, cap refill < 2 seconds Extremities: warm and well perfused, normal tone, no edema   Laboratory: Recent Labs  Lab 03/09/17 1311 03/10/17 0518 03/11/17 0601  WBC 15.1* 11.8* 9.6  HGB 14.8 12.2* 11.9*  HCT 43.3 36.8* 35.7*  PLT 250 251 264   Recent Labs  Lab 03/09/17 1311 03/10/17 0518  NA 136 136  K 4.3 3.8  CL 102 102  CO2 23 24  BUN 18 15  CREATININE 1.23 1.15  CALCIUM 9.1 8.3*  GLUCOSE 102* 98   I-STAT troponin: 0 x3 Influenza PCR: Negative Blood culture x2: NGTD I-STAT lactic acid: 2.04> 1.99 Lactic acid: 1.7  Imaging/Diagnostic Tests: CT Angio Chest PE  W and/or Wo Contrast (03/09/17): IMPRESSION: 1. No pulmonary embolism, with mild study limitations detailed above. 2. Cardiomegaly. Small pericardial effusion.  Coronary artery calcifications. Recommend correlation with any possible associated cardiac symptoms. 3. Small left pleural effusion. Small consolidations at the adjacent left lung base are most suggestive of associated atelectasis. Additional minimal atelectasis within the right lung. 4. Mild emphysematous change at the lung apices. 5. Mild aortic atherosclerosis.  No aneurysm or dissection.  DG Chest 2 View (03/09/17): IMPRESSION: Bibasilar opacities-question atelectasis versus airspace disease/pneumonia.    Wendee Beavers, DO 03/11/2017, 11:24 AM PGY-2, [facility] FPTS Intern pager: [phone], text pages welcome

## 2017-03-12 DIAGNOSIS — I4891 Unspecified atrial fibrillation: Secondary | ICD-10-CM

## 2017-03-12 LAB — BASIC METABOLIC PANEL
ANION GAP: 12 (ref 5–15)
BUN: 15 mg/dL (ref 6–20)
CALCIUM: 8.7 mg/dL — AB (ref 8.9–10.3)
CO2: 24 mmol/L (ref 22–32)
Chloride: 102 mmol/L (ref 101–111)
Creatinine, Ser: 1.22 mg/dL (ref 0.61–1.24)
GFR, EST NON AFRICAN AMERICAN: 57 mL/min — AB (ref >60)
Glucose, Bld: 96 mg/dL (ref 65–99)
POTASSIUM: 3.3 mmol/L — AB (ref 3.5–5.1)
Sodium: 138 mmol/L (ref 135–145)

## 2017-03-12 LAB — CBC
HCT: 37.6 % — ABNORMAL LOW (ref 39.0–52.0)
HEMOGLOBIN: 12.6 g/dL — AB (ref 13.0–17.0)
MCH: 32.6 pg (ref 26.0–34.0)
MCHC: 33.5 g/dL (ref 30.0–36.0)
MCV: 97.2 fL (ref 78.0–100.0)
Platelets: 310 10*3/uL (ref 150–400)
RBC: 3.87 MIL/uL — AB (ref 4.22–5.81)
RDW: 12.4 % (ref 11.5–15.5)
WBC: 9.9 10*3/uL (ref 4.0–10.5)

## 2017-03-12 MED ORDER — APIXABAN 5 MG PO TABS: 5.0000 mg | ORAL_TABLET | Freq: Two times a day (BID) | ORAL | 0 refills | Status: AC

## 2017-03-12 MED ORDER — LEVOFLOXACIN 750 MG PO TABS: 750.0000 mg | ORAL_TABLET | ORAL | 0 refills | Status: AC

## 2017-03-12 MED ORDER — POTASSIUM CHLORIDE CRYS ER 20 MEQ PO TBCR
40.0000 meq | EXTENDED_RELEASE_TABLET | ORAL | Status: AC
  Administered 2017-03-12 (×2): 40 meq via ORAL
  Filled 2017-03-12 (×2): qty 2

## 2017-03-12 MED ORDER — DILTIAZEM HCL 30 MG PO TABS: 30.0000 mg | ORAL_TABLET | Freq: Three times a day (TID) | ORAL | 0 refills | Status: DC

## 2017-03-12 NOTE — Discharge Summary (Signed)
Family Medicine Teaching [facility] Discharge Summary  Patient name: Victor Mcclain Medical record number: [id number] Date of birth: 1940-09-05 Age: 77 y.o. Gender: male Date of Admission: 03/09/2017  Date of Discharge: 03/12/17 Admitting Physician: Nestor Ramp, MD  Primary Care Provider: System, Pcp Not In Consultants: cardiology   Indication for Hospitalization: HAP w/ left pleural effusion   Discharge Diagnoses/Problem List:  HAP w/ left pleural effusion Atrial fibrillation with RVR, h/o ischemic stroke Noncardiac chest pain-resolved HTN CKD stage II Aortic arthrosclerosis   Disposition: home  Discharge Condition: stable, improving   Discharge Exam:  General: awake and alert, NAD Cardiovascular: RRR, no MRG Respiratory: CTAB, no wheezes, rales, or rhonchi  Abdomen: soft, non tender, non distended, bowel sounds normal  Extremities: non tender, no edema  Neuro: normal grip strength bilaterally, no focal deficits, 5/5 muscle strength   Brief Hospital Course:  Victor Mcclain is a 77 y.o. male presenting with worsening SOB x 2 days prior to admission with productive cough. On admission patient had minimal leukocytosis and lactic acidosis to 2.04. CXR and CTA show multifocal pneumonia and left sided pleural effusion. CTA obtained due to concern for PE given pleuritic chest pain, sudden SOB, and hypoxia. CTA negative for PE. Recent hospitalization so patient was diagnosed with HAP with left pleural effusion. Patient was transitioned from IV vancomycin/cefepime (3/8-3/10) to oral levaquin. Patient with associated pleuritic chest ain. Troponin neg x 3 with EKG negative for STEMI.   During admission patient was found to have Afib with RVR, with h/o ischemic stroke. Cardiology was consulted and recommended Elliquis and Cardizem.   Issues for Follow Up:  1. Outpatient cardiology follow up for Afib 2. Follow up HTN as outpatient - does not appear to be on medications and remains  normotensive 3. Follow up aortic arthrosclerosis as outpatient - currently on high intensithy statin and anticoagulated  4. Continue PO levaquin until 3/14 5. Continue Eliquis and Cardizem  6. Will need to monitor his K as outpatient.   Significant Procedures:  None  Significant Labs and Imaging:  Recent Labs  Lab 03/10/17 0518 03/11/17 0601 03/12/17 0558  WBC 11.8* 9.6 9.9  HGB 12.2* 11.9* 12.6*  HCT 36.8* 35.7* 37.6*  PLT 251 264 310   Recent Labs  Lab 03/09/17 1311 03/10/17 0518 03/12/17 0558  NA 136 136 138  K 4.3 3.8 3.3*  CL 102 102 102  CO2 23 24 24   GLUCOSE 102* 98 96  BUN 18 15 15   CREATININE 1.23 1.15 1.22  CALCIUM 9.1 8.3* 8.7*    Ref. Range 03/09/2017 18:30 03/09/2017 23:41 03/10/2017 05:18  Troponin I Latest Ref Range: <0.03 ng/mL <0.03 <0.03 <0.03    Ref. Range 03/09/2017 14:26 03/09/2017 16:01 03/09/2017 20:29 03/09/2017 23:41  Lactic Acid, Venous Latest Ref Range: 0.5 - 1.9 mmol/L 2.04 (HH) 1.99 (H) 1.7 0.7    Ref. Range 03/09/2017 14:03  Influenza A By PCR Latest Ref Range: NEGATIVE  NEGATIVE  Influenza B By PCR Latest Ref Range: NEGATIVE  NEGATIVE   Dg Chest 2 View  Result Date: 03/09/2017 CLINICAL DATA:  LEFT chest pain. EXAM: CHEST - 2 VIEW COMPARISON:  01/20/2017 chest radiographs FINDINGS: Mild cardiomegaly noted. A loop recorder is present. Bibasilar opacities are identified-question atelectasis versus airspace disease. There is no evidence of pulmonary edema, mass, pleural effusion or pneumothorax. No acute bony abnormalities are identified. IMPRESSION: Bibasilar opacities-question atelectasis versus airspace disease/pneumonia. Electronically Signed   By: Harmon Pier M.D.   On: 03/09/2017 13:21  Ct Angio Chest Pe W And/or Wo Contrast  Result Date: 03/09/2017 CLINICAL DATA:  Left lower chest pain, worse with deep breathing. Cough. Recent stroke. Loop recorder in place. EXAM: CT ANGIOGRAPHY CHEST WITH CONTRAST TECHNIQUE: Multidetector CT imaging of the chest was  performed using the standard protocol during bolus administration of intravenous contrast. Multiplanar CT image reconstructions and MIPs were obtained to evaluate the vascular anatomy. CONTRAST:  100mL ISOVUE-370 IOPAMIDOL (ISOVUE-370) INJECTION 76% COMPARISON:  None. FINDINGS: Cardiovascular: Cardiomegaly. Coronary artery calcifications. Small pericardial effusion. Mild aortic atherosclerosis. No thoracic aortic aneurysm or evidence of aortic dissection. Some of the most peripheral segmental and subsegmental pulmonary artery branches are difficult to definitively characterize due to patient breathing motion artifact, however, there is no pulmonary embolism identified within the main, lobar or central segmental pulmonary arteries bilaterally. Mediastinum/Nodes: No mass or enlarged lymph nodes within the mediastinum or perihilar regions. Esophagus appears normal. Trachea and central bronchi are unremarkable. Lungs/Pleura: Small left pleural effusion. Small consolidations at the left lung base are likely associated atelectasis. Minimal atelectasis within the dependent aspects of the right lung. Mild emphysematous change at the lung apices. Upper Abdomen: No acute abnormality. Musculoskeletal: Degenerative spurring throughout the kyphotic thoracic spine, mild to moderate in degree. No acute or suspicious osseous finding. Review of the MIP images confirms the above findings. IMPRESSION: 1. No pulmonary embolism, with mild study limitations detailed above. 2. Cardiomegaly. Small pericardial effusion. Coronary artery calcifications. Recommend correlation with any possible associated cardiac symptoms. 3. Small left pleural effusion. Small consolidations at the adjacent left lung base are most suggestive of associated atelectasis. Additional minimal atelectasis within the right lung. 4. Mild emphysematous change at the lung apices. 5. Mild aortic atherosclerosis.  No aneurysm or dissection. Aortic Atherosclerosis  (ICD10-I70.0) and Emphysema (ICD10-J43.9). Electronically Signed   By: Bary RichardStan  Maynard M.D.   On: 03/09/2017 16:21    Results/Tests Pending at Time of Discharge:  Unresulted Labs (From admission, onward)   None     Discharge Medications:  Allergies as of 03/12/2017      Reactions   Novocain [procaine] Anaphylaxis   And any similar meds   Other Anaphylaxis   Any med ending in -caine   Tape Other (See Comments)   Patient prefers paper tape   Vancomycin Other (See Comments)   IV caused redness and burning in the arm; at site      Medication List    STOP taking these medications   azithromycin 250 MG tablet Commonly known as:  ZITHROMAX     TAKE these medications   acetaminophen 500 MG tablet Commonly known as:  TYLENOL Take 500-1,000 mg by mouth every 6 (six) hours as needed (for pain or headaches).   apixaban 5 MG Tabs tablet Commonly known as:  ELIQUIS Take 1 tablet (5 mg total) by mouth 2 (two) times daily.   aspirin 325 MG tablet Take 1 tablet (325 mg total) by mouth daily.   atorvastatin 40 MG tablet Commonly known as:  LIPITOR Take 1 tablet (40 mg total) by mouth daily at 6 PM.   diltiazem 30 MG tablet Commonly known as:  CARDIZEM Take 1 tablet (30 mg total) by mouth every 8 (eight) hours.   glucosamine-chondroitin 500-400 MG tablet Take 1 tablet by mouth daily.   levofloxacin 750 MG tablet Commonly known as:  LEVAQUIN Take 1 tablet (750 mg total) by mouth daily for 3 days.   multivitamin tablet Take 1 tablet by mouth daily.  Discharge Instructions: Please refer to Patient Instructions section of EMR for full details.  Patient was counseled important signs and symptoms that should prompt return to medical care, changes in medications, dietary instructions, activity restrictions, and follow up appointments.   Follow-Up Appointments: Follow-up Information    Marinus Maw, MD. Schedule an appointment as soon as possible for a visit in 1 week(s).    Specialty:  Cardiology Why:  Please follow up with your cardiologist within 1 week of discharge Contact information: [address] [place] [address] [phone]           Oralia Manis, DO 03/13/2017, 3:53 PM PGY-1, [facility]

## 2017-03-12 NOTE — Care Management Note (Addendum)
Case Management Note  Patient Details  Name: Victor Mcclain MRN: [id number] Date of Birth: 1940/02/17  Subjective/Objective:      Pt with  HAP w/ left pleural effusion. Hospital course, Atrial fibrillation with RVR. From home with wife independent with ADL's, no DME.  Osiah Haring (Spouse)     [phone]       PCP: Dr. Vara Guardian  Action/Plan:  Benefits check for Eliquis 1. ELIQUIS 5 MG BID  COVER- YES  CO-PAY- $ 142.00  TIER- 3 DRUG  PRIOR APPROVAL- NO  DEDUCTIBLE: NOT MET   2. ELIQUIS 2.5 MG BID  COVER- YES  CO-PAY- $ 142.00  TIER- 3 DRUG  PRIOR APPROVAL- NO  DEDUCTIBLE NOT MET  NCM made pt aware. Whitman Hero RN,BSN,CM 03/12/2017 @ 2:27PM    NCM to provide pt with 30 day free Eliquis card. Benefits check (Eliquis) in process, NCM to f/u with results and provide to pt.  Wife to provide transportation to home.   Expected Discharge Date:   03/12/2017           Expected Discharge Plan:  Home/Self Care  In-House Referral:   N/A  Discharge planning Services  CM Consult  Post Acute Care Choice:    Choice offered to:   N/A  DME Arranged:   N/A DME Agency:   N/A  HH Arranged:   N/A HH Agency:   N/A  Status of Service:  COMPLETED  If discussed at Long Length of Stay Meetings, dates discussed:    Additional Comments:  Sharin Mons, RN 03/12/2017, 11:56 AM

## 2017-03-12 NOTE — Progress Notes (Signed)
Patient discharge teaching given, including activity, diet, follow-up appoints, and medications. Patient verbalized understanding of all discharge instructions. IV access was d/c'd. Vitals are stable. Skin is intact except as charted in most recent assessments. Pt to be escorted out by NT, to be driven home by family. 

## 2017-03-12 NOTE — Progress Notes (Signed)
Pt. States he received a pill which he thought to be "xarelto," this am by unknown staff member "young, white girl." But couldn't divulge other information. This RN followed up with the Pharmacist and questioned further. Will continue to monitor.

## 2017-03-12 NOTE — Progress Notes (Addendum)
Family Medicine Teaching Service Daily Progress Note Intern Pager: [phone]  Patient name: Victor Mcclain Medical record number: [id number] Date of birth: 05/05/1940 Age: 77 y.o. Gender: male  Primary Care Provider: System, Pcp Not In Consultants: Cardiology  Code Status: Full   Pt Overview and Major Events to Date:  3/08: Admitted for HAP with hypoxia; started on Vancomycin and Cefepime  3/09: Remains rate controlled on oral Cardizem and switch to Eliquis  Assessment and Plan: Victor Tuttleis a 76 y.o.malepresenting with SOB x 2days. PMH is significant for HTN with ischemic stroke in Jan 2019.  HAP with left pleural effusion-improving  Patient currently on room air without increased work of breathing. Leukocytosis and lactic acidosis resolved. Blood cx NGTD.  -continue oral levaquin 750mg  q24hr -O2 supplementation as needed to keep sats greater than 90%, incentive spirometry  Atrial fibrillation with RVR  h/o ischemic stroke Patient currently has a loop recorder for possible embolic stroke thought to be from A. fib. CHADSVASC of 4. Neurologically, patient remains without neuro deficit. Remains rate controlled in the 70s on oral Cardizem.  Patient remains asymptomatic. -Cardiology consulted, appreciate recommendations: continue Eliquis and cardizem  -continue Cardizem 30mg  q8hrs  -continue Eliquis 5mg  bid  -continue home Lipitor 40 mg daily  Hypokalemia K of 3.3, previously normal.  -replete with KDUR as needed  Noncardiac chest pain-resolved Seems to be pleuritic in nature, likely 2/2 pneumonia. Remains without chest pain or shortness of breath. Troponins neg x 3.   -Monitoring for signs of ACS  Hypertension Per chart review without home antihypertensives.  Remains normotensive. -Follow-up outpatient  Chronic kidney disease stage II Cr of 1.22. Remains at baseline 1.2. -Avoid nephrotoxic medications  Aortic arthrosclerosis Stable finding on CT.  Currently on high  intensity statin and anticoagulated. -Follow-up outpatient  FEN/GI:Heart healthy diet Prophylaxis:Eliquis  Disposition: discharge to home   Subjective:  Patient today states he feels well. Denies SOB. States still some cough with clear sputum production. Denies CP or palpitations. Denies pain. Laughing and joking with staff in room.   Objective: Temp:  [97.6 F (36.4 C)-98.8 F (37.1 C)] 97.6 F (36.4 C) (03/11 0831) Pulse Rate:  [69-75] 71 (03/11 0831) Resp:  [17-18] 17 (03/11 0831) BP: (124-139)/(73-92) 138/84 (03/11 0831) SpO2:  [93 %-96 %] 96 % (03/11 0831) Physical Exam: General: awake and alert, NAD Cardiovascular: RRR, no MRG Respiratory: CTAB, no wheezes, rales, or rhonchi  Abdomen: soft, non tender, non distended, bowel sounds normal  Extremities: non tender, no edema  Neuro: normal grip strength bilaterally, no focal deficits, 5/5 muscle strength   Laboratory: Recent Labs  Lab 03/10/17 0518 03/11/17 0601 03/12/17 0558  WBC 11.8* 9.6 9.9  HGB 12.2* 11.9* 12.6*  HCT 36.8* 35.7* 37.6*  PLT 251 264 310   Recent Labs  Lab 03/09/17 1311 03/10/17 0518 03/12/17 0558  NA 136 136 138  K 4.3 3.8 3.3*  CL 102 102 102  CO2 23 24 24   BUN 18 15 15   CREATININE 1.23 1.15 1.22  CALCIUM 9.1 8.3* 8.7*  GLUCOSE 102* 98 96    Imaging/Diagnostic Tests: Dg Chest 2 View  Result Date: 03/09/2017 CLINICAL DATA:  LEFT chest pain. EXAM: CHEST - 2 VIEW COMPARISON:  01/20/2017 chest radiographs FINDINGS: Mild cardiomegaly noted. A loop recorder is present. Bibasilar opacities are identified-question atelectasis versus airspace disease. There is no evidence of pulmonary edema, mass, pleural effusion or pneumothorax. No acute bony abnormalities are identified. IMPRESSION: Bibasilar opacities-question atelectasis versus airspace disease/pneumonia. Electronically Signed  By: Harmon Pier M.D.   On: 03/09/2017 13:21   Ct Angio Chest Pe W And/or Wo Contrast  Result Date:  03/09/2017 CLINICAL DATA:  Left lower chest pain, worse with deep breathing. Cough. Recent stroke. Loop recorder in place. EXAM: CT ANGIOGRAPHY CHEST WITH CONTRAST TECHNIQUE: Multidetector CT imaging of the chest was performed using the standard protocol during bolus administration of intravenous contrast. Multiplanar CT image reconstructions and MIPs were obtained to evaluate the vascular anatomy. CONTRAST:  ISOVUE-370 IOPAMIDOL (ISOVUE-370) INJECTION 76% COMPARISON:  None. FINDINGS: Cardiovascular: Cardiomegaly. Coronary artery calcifications. Small pericardial effusion. Mild aortic atherosclerosis. No thoracic aortic aneurysm or evidence of aortic dissection. Some of the most peripheral segmental and subsegmental pulmonary artery branches are difficult to definitively characterize due to patient breathing motion artifact, however, there is no pulmonary embolism identified within the main, lobar or central segmental pulmonary arteries bilaterally. Mediastinum/Nodes: No mass or enlarged lymph nodes within the mediastinum or perihilar regions. Esophagus appears normal. Trachea and central bronchi are unremarkable. Lungs/Pleura: Small left pleural effusion. Small consolidations at the left lung base are likely associated atelectasis. Minimal atelectasis within the dependent aspects of the right lung. Mild emphysematous change at the lung apices. Upper Abdomen: No acute abnormality. Musculoskeletal: Degenerative spurring throughout the kyphotic thoracic spine, mild to moderate in degree. No acute or suspicious osseous finding. Review of the MIP images confirms the above findings. IMPRESSION: 1. No pulmonary embolism, with mild study limitations detailed above. 2. Cardiomegaly. Small pericardial effusion. Coronary artery calcifications. Recommend correlation with any possible associated cardiac symptoms. 3. Small left pleural effusion. Small consolidations at the adjacent left lung base are most suggestive of  associated atelectasis. Additional minimal atelectasis within the right lung. 4. Mild emphysematous change at the lung apices. 5. Mild aortic atherosclerosis.  No aneurysm or dissection. Aortic Atherosclerosis (ICD10-I70.0) and Emphysema (ICD10-J43.9). Electronically Signed   By: Bary Richard M.D.   On: 03/09/2017 16:21     Oralia Manis, DO 03/12/2017, 9:44 AM PGY-1, [facility] FPTS Intern pager: [phone], text pages welcome

## 2017-03-12 NOTE — Discharge Instructions (Signed)
You were admitted for pneumonia. You were on IV vancomycin and cefepime and were transitioned to oral levaquin prior to discharge. Please continue the Levaquin for until 3/14.   For your heart failure please follow up with cardiology as soon as possible. Please continue the cardizem and eliquis. \  Please follow up with your PCP within 1 week of discharge.   Please come to the emergency room if you have chest pain or shortness of breath.     Information on my medicine - ELIQUIS (apixaban)  This medication education was reviewed with me or my healthcare representative as part of my discharge preparation.  The pharmacist that spoke with me during my hospital stay was:  [facility], Maryanna ShapeJennifer Danielle, [facility]  Why was Eliquis prescribed for you? Eliquis was prescribed for you to reduce the risk of a blood clot forming that can cause a stroke if you have a medical condition called atrial fibrillation (a type of irregular heartbeat).  What do You need to know about Eliquis ? Take your Eliquis TWICE DAILY - one tablet in the morning and one tablet in the evening with or without food. If you have difficulty swallowing the tablet whole please discuss with your pharmacist how to take the medication safely.  Take Eliquis exactly as prescribed by your doctor and DO NOT stop taking Eliquis without talking to the doctor who prescribed the medication.  Stopping may increase your risk of developing a stroke.  Refill your prescription before you run out.  After discharge, you should have regular check-up appointments with your healthcare provider that is prescribing your Eliquis.  In the future your dose may need to be changed if your kidney function or weight changes by a significant amount or as you get older.  What do you do if you miss a dose? If you miss a dose, take it as soon as you remember on the same day and resume taking twice daily.  Do not take more than one dose of ELIQUIS at the same time to  make up a missed dose.  Important Safety Information A possible side effect of Eliquis is bleeding. You should call your healthcare provider right away if you experience any of the following: ? Bleeding from an injury or your nose that does not stop. ? Unusual colored urine (red or dark brown) or unusual colored stools (red or black). ? Unusual bruising for unknown reasons. ? A serious fall or if you hit your head (even if there is no bleeding).  Some medicines may interact with Eliquis and might increase your risk of bleeding or clotting while on Eliquis. To help avoid this, consult your healthcare provider or pharmacist prior to using any new prescription or non-prescription medications, including herbals, vitamins, non-steroidal anti-inflammatory drugs (NSAIDs) and supplements.  This website has more information on Eliquis (apixaban): http://www.eliquis.com/eliquis/home

## 2017-03-13 ENCOUNTER — Telehealth: Payer: Self-pay | Admitting: Internal Medicine

## 2017-03-13 DIAGNOSIS — I4891 Unspecified atrial fibrillation: Secondary | ICD-10-CM

## 2017-03-13 NOTE — Telephone Encounter (Signed)
Pt's wife would like to know if pt needs to continue tasking aspirin 325 mg daily. Pt   is on Eliquis 5 mg BID. Mrs Earna Coderuttle said that one of the Drs in the hospital states that pt didn't need to take aspirin, but the aspirin was listed as one of the medications pt needed to take. Pt needs clarification. Pt had a stroke in January this year. Pt has a post hospital O/V on 03/21/17 with Tereso NewcomerScott Weaver PA.

## 2017-03-13 NOTE — Telephone Encounter (Signed)
Mrs. Victor Mcclain is calling because she has a question about her husband taking aspirin . Please call

## 2017-03-14 LAB — CULTURE, BLOOD (ROUTINE X 2)
CULTURE: NO GROWTH
Culture: NO GROWTH
Special Requests: ADEQUATE
Special Requests: ADEQUATE

## 2017-03-20 NOTE — Telephone Encounter (Signed)
Discussed with Dr. Ladona Ridgelaylor.  Defer to neuro. Notified Tereso NewcomerScott Weaver, Lorin PicketScott has appt with family tomorrow and will discuss ASA. No action needed  At this time.

## 2017-03-21 ENCOUNTER — Ambulatory Visit: Payer: Medicare HMO | Admitting: Physician Assistant

## 2017-03-21 ENCOUNTER — Encounter: Payer: Self-pay | Admitting: *Deleted

## 2017-03-21 ENCOUNTER — Encounter: Payer: Self-pay | Admitting: Physician Assistant

## 2017-03-21 VITALS — BP 115/77 | HR 62 | Ht 73.0 in | Wt 211.0 lb

## 2017-03-21 DIAGNOSIS — I700 Atherosclerosis of aorta: Secondary | ICD-10-CM | POA: Diagnosis not present

## 2017-03-21 DIAGNOSIS — I313 Pericardial effusion (noninflammatory): Secondary | ICD-10-CM | POA: Diagnosis not present

## 2017-03-21 DIAGNOSIS — I2510 Atherosclerotic heart disease of native coronary artery without angina pectoris: Secondary | ICD-10-CM

## 2017-03-21 DIAGNOSIS — I480 Paroxysmal atrial fibrillation: Secondary | ICD-10-CM | POA: Diagnosis not present

## 2017-03-21 DIAGNOSIS — I3139 Other pericardial effusion (noninflammatory): Secondary | ICD-10-CM

## 2017-03-21 DIAGNOSIS — E785 Hyperlipidemia, unspecified: Secondary | ICD-10-CM | POA: Diagnosis not present

## 2017-03-21 DIAGNOSIS — Z8679 Personal history of other diseases of the circulatory system: Secondary | ICD-10-CM

## 2017-03-21 DIAGNOSIS — Z8673 Personal history of transient ischemic attack (TIA), and cerebral infarction without residual deficits: Secondary | ICD-10-CM | POA: Diagnosis not present

## 2017-03-21 DIAGNOSIS — R0789 Other chest pain: Secondary | ICD-10-CM

## 2017-03-21 HISTORY — DX: Atherosclerotic heart disease of native coronary artery without angina pectoris: I25.10

## 2017-03-21 HISTORY — DX: Atherosclerosis of aorta: I70.0

## 2017-03-21 MED ORDER — DILTIAZEM HCL 30 MG PO TABS: 30.0000 mg | ORAL_TABLET | Freq: Two times a day (BID) | ORAL | 3 refills | Status: DC

## 2017-03-21 NOTE — Patient Instructions (Addendum)
Medication Instructions:  1. CONTINUE ON DILTIAZEM 30 MG TWICE DAILY   Labwork: CBC, BMET TO BE DONE WHEN YOU SEE DR. Ladona RidgelAYLOR  Testing/Procedures: 1. Your physician has requested that you have a lexiscan myoview. For further information please visit [email or web address]. Please follow instruction sheet, as given.  2. Your physician has requested that you have an LIMITED echocardiogram TO CHECK PERICARDIAL EFFUSION  Echocardiography is a painless test that uses sound waves to create images of your heart. It provides your doctor with information about the size and shape of your heart and how well your heart's chambers and valves are working. This procedure takes approximately one hour. There are no restrictions for this procedure. THIS IS TO BE DONE ON 03/28/17 @ 4 PM . PLEASE ARRIVE 30 MINUTES EARLY FOR REGISTRATION.    Follow-Up: 1. FOLLOW UP WITH PRIMARY CARE POST HOSPITAL DIRECTIONS FOR PNEUMONIA   2. DR. Ladona RidgelAYLOR IN 6-8 WEEKS FOR A-FIB; I WILL HAVE MELISSA TATUM EP SCHEDULER CALL YOU WITH THE APPT FOR DR. Ladona RidgelAYLOR  Any Other Special Instructions Will Be Listed Below (If Applicable).   CALL THE OFFICE [phone] OR GO TO THE ED IF YOU HAVE WORSENING CHEST PAIN  If you need a refill on your cardiac medications before your next appointment, please call your pharmacy.

## 2017-03-21 NOTE — Progress Notes (Signed)
Cardiology Office Note:    Date:  03/21/2017   ID:  Victor Mcclain, DOB 06/04/1940, MRN [id number]  PCP:  System, Pcp Not In  Cardiologist:  Lewayne BuntingGregg Taylor, MD   Referring MD: No ref. provider found   Chief Complaint  Patient presents with  . Hospitalization Follow-up    AFib, hx of CVA, s/p ILR    History of Present Illness:    Victor Mcclain is a 77 y.o. male with hypertension (controlled off of medication), chronic kidney disease, BPH.  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  He was seen by cardiology and underwent implantation of a Linq ILR by Dr. Ladona Ridgelaylor.  He was readmitted 3/8-3/11 with pneumonia with associated left pleural effusion.  He developed atrial fibrillation with rapid ventricular rate.  He was seen by cardiology and placed on Apixaban for anticoagulation.  He was placed on low-dose diltiazem and converted to normal sinus rhythm prior to discharge.  Of note, chest x-ray after follow-up with PCP demonstrated left lower lobe pneumonia and small left pleural effusion.  He was placed back on Levaquin for 1 more week.  CHADS2-VASc= (3) 4 (age x 1, ?HTN, CVA).   Victor Mcclain returns for follow-up.  He is here today with his wife.  He has felt poorly since being diagnosed with pneumonia.  He has been short of breath.  He had an episode of chest pain yesterday.  This felt like the chest discomfort he had when he presented to the hospital with pneumonia.  He has had a mild cough with clear sputum production.  He denies fevers, orthopnea, PND or edema.  He denies syncope.  His appetite has been poor.  He notes significant fatigue.  He is supposed to follow-up with his physician in [facility] in 3 weeks to repeat his chest x-ray.  Prior CV studies:   The following studies were reviewed today:  Chest CTA 03/09/17 IMPRESSION: 1. No pulmonary embolism, with mild study limitations detailed above. 2. Cardiomegaly. Small pericardial effusion. Coronary artery calcifications.  Recommend correlation with any possible associated cardiac symptoms. 3. Small left pleural effusion. Small consolidations at the adjacent left lung base are most suggestive of associated atelectasis. Additional minimal atelectasis within the right lung. 4. Mild emphysematous change at the lung apices.  5. Mild aortic atherosclerosis.  No aneurysm or dissection.  TEE 01/22/17 EF 55-60, normal wall motion, mild AI, mild MR, LAE, no LAA clot  Echo 01/20/17 Mild LVH, EF 60-65, normal wall motion, grade 1 diastolic dysfunction, mild AI, mild LAE  Carotid US 01/20/17 Bilateral ICA 1-39  Past Medical History:  Diagnosis Date  . Aortic atherosclerosis (HCC) 03/21/2017   Chest CTA 03/09/17  . Cataract   . Coronary artery calcification seen on CT scan 03/21/2017   Chest CTA 03/09/17  . History of hypertension    no meds // Diltiazem started in 3/19 for AFib  . History of stroke 01/2017   s/p ILR // AFib noted during re-hosp for pneumonia in 3/19 >> Eliquis started  . PAF (paroxysmal atrial fibrillation) (HCC)    Surgical Hx: The patient  has a past surgical history that includes Esophageal stretching; TEE without cardioversion (N/A, 01/22/2017); and LOOP RECORDER INSERTION (N/A, 01/22/2017).   Current Medications: Current Meds  Medication Sig  . acetaminophen (TYLENOL) 500 MG tablet Take 500-1,000 mg by mouth every 6 (six) hours as needed (for pain or headaches).   Marland Kitchen. apixaban (ELIQUIS) 5 MG TABS tablet Take 1 tablet (5 mg total)  by mouth 2 (two) times daily.  Marland Kitchen atorvastatin (LIPITOR) 40 MG tablet Take 1 tablet (40 mg total) by mouth daily at 6 PM.  . glucosamine-chondroitin 500-400 MG tablet Take 1 tablet by mouth daily.  Marland Kitchen levofloxacin (LEVAQUIN) 500 MG tablet Take 500 mg by mouth as directed.   . Multiple Vitamin (MULTIVITAMIN) tablet Take 1 tablet by mouth daily.  . [DISCONTINUED] diltiazem (CARDIZEM) 30 MG tablet Take 1 tablet (30 mg total) by mouth every 8 (eight) hours.     Allergies:    Novocain [procaine]; Other; Tape; and Vancomycin   Social History   Tobacco Use  . Smoking status: Former Smoker    Packs/day: 3.00    Types: Cigarettes    Last attempt to quit: 01/19/1986    Years since quitting: 31.1  . Smokeless tobacco: Current User    Types: Chew  Substance Use Topics  . Alcohol use: No    Frequency: Never  . Drug use: No     Family Hx: The patient's family history includes Cancer in his brother and father; Cerebral aneurysm in his sister; Stroke in his mother; Transient ischemic attack in his mother.  ROS:   Please see the history of present illness.    Review of Systems  Constitution: Positive for decreased appetite.  HENT: Positive for hearing loss.   Cardiovascular: Positive for chest pain, dyspnea on exertion and irregular heartbeat.  Respiratory: Positive for cough, shortness of breath and wheezing.    All other systems reviewed and are negative.   EKGs/Labs/Other Test Reviewed:    EKG:  EKG is  ordered today.  The ekg ordered today demonstrates normal sinus rhythm, heart rate 62, normal axis, right bundle branch block, T wave inversions V3-V6, similar to prior tracings in January 2019  Recent Labs: 01/22/2017: ALT 13; Magnesium 2.1 03/12/2017: BUN 15; Creatinine, Ser 1.22; Hemoglobin 12.6; Platelets 310; Potassium 3.3; Sodium 138   Recent Lipid Panel Lab Results  Component Value Date/Time   CHOL 167 01/20/2017 04:11 AM   TRIG 115 01/20/2017 04:11 AM   HDL 37 (L) 01/20/2017 04:11 AM   CHOLHDL 4.5 01/20/2017 04:11 AM   LDLCALC 107 (H) 01/20/2017 04:11 AM    Physical Exam:    VS:  BP 115/77   Pulse 62   Ht 6\' 1"  (1.854 m)   Wt 211 lb (95.7 kg)   SpO2 97%   BMI 27.84 kg/m     Wt Readings from Last 3 Encounters:  03/21/17 211 lb (95.7 kg)  03/09/17 217 lb 12.8 oz (98.8 kg)  01/19/17 229 lb 15 oz (104.3 kg)     Physical Exam  Constitutional: He is oriented to person, place, and time. He appears well-developed and well-nourished. No  distress.  HENT:  Head: Normocephalic and atraumatic.  Neck: No JVD present.  Cardiovascular: Normal rate and regular rhythm. Exam reveals no friction rub.  No murmur heard. Pulmonary/Chest: Effort normal. He has no wheezes. He has no rales.  Abdominal: Soft.  Musculoskeletal: He exhibits no edema.  Neurological: He is alert and oriented to person, place, and time.  Skin: Skin is warm and dry.    ASSESSMENT & PLAN:    #1.  PAF (paroxysmal atrial fibrillation) (HCC) Maintaining normal sinus rhythm.  He is tolerating diltiazem 30 mg twice a day.  He is also tolerating his anticoagulation with Apixaban.  Continue current medical regimen.  Arrange follow-up with Dr. Ladona Ridgel in 6-8 weeks.  Obtain follow-up BMET, CBC at that visit.  #2.  History of stroke Continue follow-up with neurology.  He is status post ILR.  He is now off of aspirin as he is on Apixaban.  He sees neurology next week for follow-up.  #3.  History of hypertension Blood pressure currently controlled.  #4.  Other chest pain He had an episode of chest discomfort yesterday that lasted about 2 hours.  This was similar to the chest pain he had when he presented to the hospital with his pneumonia.  Cardiac enzymes were negative at that time.  He does have evidence of coronary calcifications on chest CT.  He is certainly at risk for coronary artery disease.  However, I suspect his chest discomfort is likely related to his pneumonia.  In any event, we should assess him for ischemic heart disease.  -Arrange Lexiscan Myoview once recovered from pneumonia  -He knows to go the emergency room if he has worsening chest pain  #5.  Pericardial effusion Small effusion noted on recent CT.  Arrange limited echo to follow-up on effusion.  #6.  Aortic atherosclerosis (HCC) Continue statin.  He is not on aspirin as he is on Apixaban.  #7.  Pneumonia Continue antibiotics and follow-up with Dr. Virginia Rochester in [facility].  He knows to contact him  sooner or go to the emergency room if he should feel worse.  If pneumonia persists, he may benefit from referral to Pulmonology.     Dispo:  Return in about 6 weeks (around 05/02/2017) for Close Follow Up w/ Dr. Ladona Ridgel.   Medication Adjustments/Labs and Tests Ordered: Current medicines are reviewed at length with the patient today.  Concerns regarding medicines are outlined above.  Tests Ordered: Orders Placed This Encounter  Procedures  . Basic Metabolic Panel (BMET)  . CBC  . MYOCARDIAL PERFUSION IMAGING  . EKG 12-Lead  . ECHOCARDIOGRAM LIMITED   Medication Changes: Meds ordered this encounter  Medications  . diltiazem (CARDIZEM) 30 MG tablet    Sig: Take 1 tablet (30 mg total) by mouth 2 (two) times daily.    Dispense:  180 tablet    Refill:  3    Signed, Tereso Newcomer, PA-C  03/21/2017 3:42 PM    [facility] [address] [place], [place], [place]  [address] Phone: [phone]; Fax: [phone]

## 2017-03-26 LAB — CUP PACEART REMOTE DEVICE CHECK
Implantable Pulse Generator Implant Date: 20190121
MDC IDC SESS DTM: 20190221163959

## 2017-03-27 ENCOUNTER — Telehealth: Payer: Self-pay

## 2017-03-27 ENCOUNTER — Ambulatory Visit (INDEPENDENT_AMBULATORY_CARE_PROVIDER_SITE_OTHER): Payer: Medicare HMO | Admitting: *Deleted

## 2017-03-27 DIAGNOSIS — I639 Cerebral infarction, unspecified: Secondary | ICD-10-CM | POA: Diagnosis not present

## 2017-03-27 NOTE — Telephone Encounter (Signed)
Left message for Pt per DPR. Notified Pt needs to be cleared to drive by his neurologist.  If neurology clears Pt to drive, ok from cards standpoint. Left name and # to call back if further questions.

## 2017-03-27 NOTE — Progress Notes (Signed)
Carelink Summary Report / Loop Recorder 

## 2017-03-27 NOTE — Telephone Encounter (Signed)
-----   Message from Marinus MawGregg W Taylor, MD sent at 03/25/2017  7:56 PM EDT ----- I am confused. I saw him and placed an ILR. If he has had a stroke and neuro says he can drive, no limitation from my perspective. GT ----- Message ----- From: Rollene RotundaHochrein, James, MD Sent: 03/11/2017  11:18 AM To: Marinus MawGregg W Taylor, MD  Your patient.  The only thing he wants to be able to do is go bass fishing with his granddaughter.  She is 15 and can back the boat up and drive.  She has been winning the women's division at the tournaments since she was 3013.  He wants to be back out their with her.  Please make that happen.

## 2017-03-28 ENCOUNTER — Encounter: Payer: Self-pay | Admitting: Neurology

## 2017-03-28 ENCOUNTER — Telehealth: Payer: Self-pay | Admitting: *Deleted

## 2017-03-28 ENCOUNTER — Telehealth (HOSPITAL_COMMUNITY): Payer: Self-pay | Admitting: *Deleted

## 2017-03-28 ENCOUNTER — Other Ambulatory Visit: Payer: Self-pay

## 2017-03-28 ENCOUNTER — Ambulatory Visit: Payer: Medicare HMO | Admitting: Neurology

## 2017-03-28 ENCOUNTER — Ambulatory Visit (HOSPITAL_COMMUNITY): Payer: Medicare HMO | Attending: Cardiology

## 2017-03-28 VITALS — BP 136/60 | HR 82 | Ht 73.0 in | Wt 212.8 lb

## 2017-03-28 DIAGNOSIS — I63431 Cerebral infarction due to embolism of right posterior cerebral artery: Secondary | ICD-10-CM | POA: Diagnosis not present

## 2017-03-28 DIAGNOSIS — I4891 Unspecified atrial fibrillation: Secondary | ICD-10-CM | POA: Diagnosis not present

## 2017-03-28 DIAGNOSIS — Z8673 Personal history of transient ischemic attack (TIA), and cerebral infarction without residual deficits: Secondary | ICD-10-CM | POA: Insufficient documentation

## 2017-03-28 DIAGNOSIS — I119 Hypertensive heart disease without heart failure: Secondary | ICD-10-CM | POA: Insufficient documentation

## 2017-03-28 DIAGNOSIS — I3139 Other pericardial effusion (noninflammatory): Secondary | ICD-10-CM

## 2017-03-28 DIAGNOSIS — I313 Pericardial effusion (noninflammatory): Secondary | ICD-10-CM | POA: Insufficient documentation

## 2017-03-28 DIAGNOSIS — I351 Nonrheumatic aortic (valve) insufficiency: Secondary | ICD-10-CM | POA: Insufficient documentation

## 2017-03-28 DIAGNOSIS — I77810 Thoracic aortic ectasia: Secondary | ICD-10-CM | POA: Diagnosis not present

## 2017-03-28 DIAGNOSIS — I358 Other nonrheumatic aortic valve disorders: Secondary | ICD-10-CM | POA: Insufficient documentation

## 2017-03-28 DIAGNOSIS — G3184 Mild cognitive impairment, so stated: Secondary | ICD-10-CM

## 2017-03-28 LAB — ECHOCARDIOGRAM LIMITED
HEIGHTINCHES: 73 in
Weight: 3404.8 oz

## 2017-03-28 NOTE — Patient Instructions (Signed)
I had a long d/w patient, his wife and his sister about his recent thalamicstroke,atrial fibrillation, risk for recurrent stroke/TIAs, personally independently reviewed imaging studies and stroke evaluation results and answered questions.Continue Eliquis (apixaban) daily  for secondary stroke prevention and maintain strict control of hypertension with blood pressure goal below 130/90, diabetes with hemoglobin A1c goal below 6.5% and lipids with LDL cholesterol goal below 70 mg/dL. I also advised the patient to eat a healthy diet with plenty of whole grains, cereals, fruits and vegetables, exercise regularly and maintain ideal body weight ibuprofen advised the patient not to drive for now due to his cognitive impairment. Check memory panel labs and EEG.Followup in the future with my practitioner Shanda Bumps in 3 months or call earlier if necessary  Stroke Prevention Some medical conditions and behaviors are associated with a higher chance of having a stroke. You can help prevent a stroke by making nutrition, lifestyle, and other changes, including managing any medical conditions you may have. What nutrition changes can be made?  Eat healthy foods. You can do this by: ? Choosing foods high in fiber, such as fresh fruits and vegetables and whole grains. ? Eating at least 5 or more servings of fruits and vegetables a day. Try to fill half of your plate at each meal with fruits and vegetables. ? Choosing lean protein foods, such as lean cuts of meat, poultry without skin, fish, tofu, beans, and nuts. ? Eating low-fat dairy products. ? Avoiding foods that are high in salt (sodium). This can help lower blood pressure. ? Avoiding foods that have saturated fat, trans fat, and cholesterol. This can help prevent high cholesterol. ? Avoiding processed and premade foods.  Follow your health care provider's specific guidelines for losing weight, controlling high blood pressure (hypertension), lowering high cholesterol,  and managing diabetes. These may include: ? Reducing your daily calorie intake. ? Limiting your daily sodium intake to 1,500 milligrams (mg). ? Using only healthy fats for cooking, such as olive oil, canola oil, or sunflower oil. ? Counting your daily carbohydrate intake. What lifestyle changes can be made?  Maintain a healthy weight. Talk to your health care provider about your ideal weight.  Get at least 30 minutes of moderate physical activity at least 5 days a week. Moderate activity includes brisk walking, biking, and swimming.  Do not use any products that contain nicotine or tobacco, such as cigarettes and e-cigarettes. If you need help quitting, ask your health care provider. It may also be helpful to avoid exposure to secondhand smoke.  Limit alcohol intake to no more than 1 drink a day for nonpregnant women and 2 drinks a day for men. One drink equals 12 oz of beer, 5 oz of wine, or 1 oz of hard liquor.  Stop any illegal drug use.  Avoid taking birth control pills. Talk to your health care provider about the risks of taking birth control pills if: ? You are over 53 years old. ? You smoke. ? You get migraines. ? You have ever had a blood clot. What other changes can be made?  Manage your cholesterol levels. ? Eating a healthy diet is important for preventing high cholesterol. If cholesterol cannot be managed through diet alone, you may also need to take medicines. ? Take any prescribed medicines to control your cholesterol as told by your health care provider.  Manage your diabetes. ? Eating a healthy diet and exercising regularly are important parts of managing your blood sugar. If your blood sugar cannot  be managed through diet and exercise, you may need to take medicines. ? Take any prescribed medicines to control your diabetes as told by your health care provider.  Control your hypertension. ? To reduce your risk of stroke, try to keep your blood pressure below  130/80. ? Eating a healthy diet and exercising regularly are an important part of controlling your blood pressure. If your blood pressure cannot be managed through diet and exercise, you may need to take medicines. ? Take any prescribed medicines to control hypertension as told by your health care provider. ? Ask your health care provider if you should monitor your blood pressure at home. ? Have your blood pressure checked every year, even if your blood pressure is normal. Blood pressure increases with age and some medical conditions.  Get evaluated for sleep disorders (sleep apnea). Talk to your health care provider about getting a sleep evaluation if you snore a lot or have excessive sleepiness.  Take over-the-counter and prescription medicines only as told by your health care provider. Aspirin or blood thinners (antiplatelets or anticoagulants) may be recommended to reduce your risk of forming blood clots that can lead to stroke.  Make sure that any other medical conditions you have, such as atrial fibrillation or atherosclerosis, are managed. What are the warning signs of a stroke? The warning signs of a stroke can be easily remembered as BEFAST.  B is for balance. Signs include: ? Dizziness. ? Loss of balance or coordination. ? Sudden trouble walking.  E is for eyes. Signs include: ? A sudden change in vision. ? Trouble seeing.  F is for face. Signs include: ? Sudden weakness or numbness of the face. ? The face or eyelid drooping to one side.  A is for arms. Signs include: ? Sudden weakness or numbness of the arm, usually on one side of the body.  S is for speech. Signs include: ? Trouble speaking (aphasia). ? Trouble understanding.  T is for time. ? These symptoms may represent a serious problem that is an emergency. Do not wait to see if the symptoms will go away. Get medical help right away. Call your local emergency services (911 in the [place] Do not drive yourself to the  hospital.  Other signs of stroke may include: ? A sudden, severe headache with no known cause. ? Nausea or vomiting. ? Seizure.  Where to find more information: For more information, visit:  American Stroke Association: www.strokeassociation.org  National Stroke Association: www.stroke.org  Summary  You can prevent a stroke by eating healthy, exercising, not smoking, limiting alcohol intake, and managing any medical conditions you may have.  Do not use any products that contain nicotine or tobacco, such as cigarettes and e-cigarettes. If you need help quitting, ask your health care provider. It may also be helpful to avoid exposure to secondhand smoke.  Remember BEFAST for warning signs of stroke. Get help right away if you or a loved one has any of these signs. This information is not intended to replace advice given to you by your health care provider. Make sure you discuss any questions you have with your health care provider. Document Released: 01/27/2004 Document Revised: 01/25/2016 Document Reviewed: 01/25/2016 Elsevier Interactive Patient Education  [employer]2018 Elsevier Inc.

## 2017-03-28 NOTE — Telephone Encounter (Signed)
-----   Message from Beatrice LecherScott T Weaver, [place]PA-C sent at 03/28/2017  6:00 PM EDT ----- Please call the patient. The echocardiogram demonstrates normal heart function, no significant valve abnormalities, no pericardial effusion. Continue current medications and follow up as planned.  Please fax a copy of this study result to his PCP:  Tarri FullerEscajeda, Richard, MD  Thanks! Tereso NewcomerScott Weaver, PA-C    03/28/2017 5:58 PM

## 2017-03-28 NOTE — Progress Notes (Signed)
[facility] [address] [place]. [place] [id number] [phone]       OFFICE FOLLOW-UP NOTE  Victor Mcclain [place] Date of Birth:  1940/06/15 Medical Record Number:  [id number]   HPI: Mr. Pricilla Mcclain is a 77 year old Caucasian male seen today for the first office follow-up visit for in-hospital admission for stroke in January 2019. He is accompanied by his wife and sister. History is obtained from them, review of electronic medical records and have personally reviewed imaging films.Oddis Tuttleis an 77 y.o.malepresenting via EMS from home with new onset of dysarthria, left facial droop and left arm weakness. LKN was 9:00 PM on Thursday. He also endorsedleaningtowards theleft with ambulation and speech "slightly off on some words"since this morning.Hiswife noticedthat the patientwas not himself this morning. He states that he is not on ASA or Plavix. He is not on a blood thinner.LSN:9:00 PM on 01/18/2017.tPA Given:No:Out of time window.CT scan of the head showed no acute abnormality. MRI scan showed a small acute infarct in the morning and to the medial right thalamus which was rather than a typical "and was greater than 1 cm in size raising concern for possible embolic etiology. 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 Transthoracic echo showed normal ejection fraction. Carotid ultrasound showed no significant extracranial stenosis. Telemetry monitoring did not reveal evidence of matrix ablation. Consistently aphasia and echocardiogram showed no evidence of clot or PFO. Patient had loop recorder inserted and in fact was found to have paroxysmal A. Fib and a subsequent hospital admission in March 19. Patient stated he was doing well from his stroke and had recovered physically no cognitively he was having issues with the short-term memory deficits getting occasionally confused and frustrated and requiring constant repetition  and having trouble remembering new information. He was struggling with tasks like dialing the telephone number using the TV remote. He has finished physical and occupation therapy. He was rehospitalized with pneumonia and pleural effusion and subsequently was found to have A. Fib on loop recorder. He has been switched to eliquis. His presently following up with cardiology and plans to have a nuclear stress test as well as an echocardiogram done next week hence he had some   pericardial effusion also. He also had some labwork done primary care physician's office which I have reviewed and on 03/19/2017 basic metabolic panel lab was normal vitamin B12 was 741.WBC count was normal.patient says that he has recovered physically but he feels quite fatigued and tired and does not have energy. He has stopped going out and not participating in his previous usual activities.   ROS:   14 system review of systems is positive for  Fatigue, palpitations, hearing loss, skin moles, shortness of breath, cough, wheezing, snoring, importance, joint pain, confusion, weakness, not enough sleep, decreased energy, snoring and all other systems negative PMH:  Past Medical History:  Diagnosis Date  . Aortic atherosclerosis (HCC) 03/21/2017   Chest CTA 03/09/17  . Cataract   . Coronary artery calcification seen on CT scan 03/21/2017   Chest CTA 03/09/17  . History of hypertension    no meds // Diltiazem started in 3/19 for AFib  . History of stroke 01/2017   s/p ILR // AFib noted during re-hosp for pneumonia in 3/19 >> Eliquis started  . PAF (paroxysmal atrial fibrillation) (HCC)   . Stroke [facility](HCC)     Social History:  Social History   Socioeconomic History  . Marital status: Married  Spouse name: Not on file  . Number of children: Not on file  . Years of education: Not on file  . Highest education level: Not on file  Occupational History  . Not on file  Social Needs  . Financial resource strain: Not on file  . Food  insecurity:    Worry: Not on file    Inability: Not on file  . Transportation needs:    Medical: Not on file    Non-medical: Not on file  Tobacco Use  . Smoking status: Former Smoker    Packs/day: 3.00    Types: Cigarettes    Last attempt to quit: 01/19/1986    Years since quitting: 31.2  . Smokeless tobacco: Former Neurosurgeon    Types: Chew  Substance and Sexual Activity  . Alcohol use: No    Frequency: Never  . Drug use: No  . Sexual activity: Yes  Lifestyle  . Physical activity:    Days per week: Not on file    Minutes per session: Not on file  . Stress: Not on file  Relationships  . Social connections:    Talks on phone: Not on file    Gets together: Not on file    Attends religious service: Not on file    Active member of club or organization: Not on file    Attends meetings of clubs or organizations: Not on file    Relationship status: Not on file  . Intimate partner violence:    Fear of current or ex partner: Not on file    Emotionally abused: Not on file    Physically abused: Not on file    Forced sexual activity: Not on file  Other Topics Concern  . Not on file  Social History Narrative  . Not on file    Medications:   Current Outpatient Medications on File Prior to Visit  Medication Sig Dispense Refill  . acetaminophen (TYLENOL) 500 MG tablet Take 500-1,000 mg by mouth every 6 (six) hours as needed (for pain or headaches).     Marland Kitchen apixaban (ELIQUIS) 5 MG TABS tablet Take 1 tablet (5 mg total) by mouth 2 (two) times daily. 60 tablet 0  . atorvastatin (LIPITOR) 40 MG tablet Take 1 tablet (40 mg total) by mouth daily at 6 PM. 30 tablet 2  . diltiazem (CARDIZEM) 30 MG tablet Take 1 tablet (30 mg total) by mouth 2 (two) times daily. 180 tablet 3  . glucosamine-chondroitin 500-400 MG tablet Take 1 tablet by mouth daily.    . Multiple Vitamin (MULTIVITAMIN) tablet Take 1 tablet by mouth daily.     No current facility-administered medications on file prior to visit.      Allergies:   Allergies  Allergen Reactions  . Novocain [Procaine] Anaphylaxis    And any similar meds  . Other Anaphylaxis    Any med ending in -caine  . Tape Other (See Comments)    Patient prefers paper tape  . Vancomycin Other (See Comments)    IV caused redness and burning in the arm; at site    Physical Exam General: well developed, well nourished elderly Caucasian male, seated, in no evident distress Head: head normocephalic and atraumatic.  Neck: supple with no carotid or supraclavicular bruits Cardiovascular: regular rate and rhythm, no murmurs Musculoskeletal: no deformity Skin:  no rash/petichiae Vascular:  Normal pulses all extremities Vitals:   03/28/17 1355  BP: 136/60  Pulse: 82   Neurologic Exam Mental Status: Awake and fully alert.  Oriented to place and time. Recent and remote memory intact. Attention span, concentration and fund of knowledge appropriate. Mood and affect appropriate. Diminished recall 1/3. Able to name only 9 animals with 4 Lex. Clock drawing 4/4. Cranial Nerves: Fundoscopic exam reveals sharp disc margins. Pupils equal, briskly reactive to light. Extraocular movements full without nystagmus. Visual fields full to confrontation. Hearing slightly diminished. Facial sensation intact. Face, tongue, palate moves normally and symmetrically.  Motor: Normal bulk and tone. Normal strength in all tested extremity muscles.diminished fine finger movements on the left. Orbits right over left upper extremity Sensory.: intact to touch ,pinprick .position and vibratory sensation.  Coordination: Rapid alternating movements normal in all extremities. Finger-to-nose and heel-to-shin performed accurately bilaterally. Gait and Station: Arises from chair without difficulty. Stance is normal. Gait demonstrates normal stride length and balance . Able to heel, toe and tandem walk with some difficulty.  Reflexes: 1+ and symmetric. Toes downgoing.   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 Mild vascular cognitive impairment poststroke..Vascular risk factors of hypertension, hyperlipidemia, atrial fibrillation, age    PLAN: I had a long d/w patient, his wife and his sister about his recent thalamicstroke,atrial fibrillation, risk for recurrent stroke/TIAs, personally independently reviewed imaging studies and stroke evaluation results and answered questions.Continue Eliquis (apixaban) daily  for secondary stroke prevention and maintain strict control of hypertension with blood pressure goal below 130/90, diabetes with hemoglobin A1c goal below 6.5% and lipids with LDL cholesterol goal below 70 mg/dL. I also advised the patient to eat a healthy diet with plenty of whole grains, cereals, fruits and vegetables, exercise regularly and maintain ideal body weight ibuprofen advised the patient not to drive for now due to his cognitive impairment. Check memory panel labs and EEG.Followup in the future with my practitioner Shanda Bumps in 3 months or call earlier if necessary Greater than 50% of time during this 25 minute visit was spent on counseling,explanation of diagnosis embolic stroke, atrial fibrillation, mild cognitive impairment, planning of further management, discussion with patient and family and coordination of care Delia Heady, MD  [facility] [address] [place], [place] [address]  Phone [phone] Fax [phone] Note: This document was prepared with digital dictation and possible smart phrase technology. Any transcriptional errors that result from this process are unintentional

## 2017-03-28 NOTE — Telephone Encounter (Signed)
DPR ok to s/w pt's wife who has been notified of echo results for the pt. Pt's wife thanked me for the call. I will fax a copy of results to PCP as well.

## 2017-03-28 NOTE — Telephone Encounter (Signed)
Patient given detailed instructions per Myocardial Perfusion Study Information Sheet for the test on 04/02/17. Patient notified to arrive 15 minutes early and that it is imperative to arrive on time for appointment to keep from having the test rescheduled.  If you need to cancel or reschedule your appointment, please call the office within 24 hours of your appointment. . Patient verbalized understanding. Effie Wahlert Jacqueline    

## 2017-03-29 LAB — DEMENTIA PANEL
Homocysteine: 12.6 umol/L (ref 0.0–15.0)
RPR Ser Ql: NONREACTIVE
TSH: 2.17 u[IU]/mL (ref 0.450–4.500)
Vitamin B-12: 773 pg/mL (ref 232–1245)

## 2017-04-02 ENCOUNTER — Encounter (HOSPITAL_COMMUNITY): Payer: Medicare HMO

## 2017-04-08 ENCOUNTER — Inpatient Hospital Stay (HOSPITAL_COMMUNITY)
Admission: EM | Admit: 2017-04-08 | Discharge: 2017-04-11 | DRG: 194 | Disposition: A | Discharge status: Home / Self Care | Payer: Medicare HMO | Attending: Family Medicine | Admitting: Family Medicine

## 2017-04-08 ENCOUNTER — Other Ambulatory Visit: Payer: Self-pay

## 2017-04-08 ENCOUNTER — Encounter (HOSPITAL_COMMUNITY): Payer: Self-pay | Admitting: Emergency Medicine

## 2017-04-08 ENCOUNTER — Emergency Department (HOSPITAL_COMMUNITY): Payer: Medicare HMO

## 2017-04-08 DIAGNOSIS — I4891 Unspecified atrial fibrillation: Secondary | ICD-10-CM

## 2017-04-08 DIAGNOSIS — R0602 Shortness of breath: Secondary | ICD-10-CM

## 2017-04-08 DIAGNOSIS — E785 Hyperlipidemia, unspecified: Secondary | ICD-10-CM | POA: Diagnosis present

## 2017-04-08 DIAGNOSIS — Z87891 Personal history of nicotine dependence: Secondary | ICD-10-CM | POA: Diagnosis not present

## 2017-04-08 DIAGNOSIS — I129 Hypertensive chronic kidney disease with stage 1 through stage 4 chronic kidney disease, or unspecified chronic kidney disease: Secondary | ICD-10-CM | POA: Diagnosis present

## 2017-04-08 DIAGNOSIS — R079 Chest pain, unspecified: Secondary | ICD-10-CM | POA: Diagnosis present

## 2017-04-08 DIAGNOSIS — N400 Enlarged prostate without lower urinary tract symptoms: Secondary | ICD-10-CM | POA: Diagnosis present

## 2017-04-08 DIAGNOSIS — Y95 Nosocomial condition: Secondary | ICD-10-CM | POA: Diagnosis present

## 2017-04-08 DIAGNOSIS — Z888 Allergy status to other drugs, medicaments and biological substances status: Secondary | ICD-10-CM | POA: Diagnosis not present

## 2017-04-08 DIAGNOSIS — Z8673 Personal history of transient ischemic attack (TIA), and cerebral infarction without residual deficits: Secondary | ICD-10-CM | POA: Diagnosis not present

## 2017-04-08 DIAGNOSIS — Z823 Family history of stroke: Secondary | ICD-10-CM

## 2017-04-08 DIAGNOSIS — J189 Pneumonia, unspecified organism: Secondary | ICD-10-CM | POA: Diagnosis not present

## 2017-04-08 DIAGNOSIS — R509 Fever, unspecified: Secondary | ICD-10-CM | POA: Diagnosis not present

## 2017-04-08 DIAGNOSIS — I480 Paroxysmal atrial fibrillation: Secondary | ICD-10-CM | POA: Diagnosis present

## 2017-04-08 DIAGNOSIS — Z91048 Other nonmedicinal substance allergy status: Secondary | ICD-10-CM | POA: Diagnosis not present

## 2017-04-08 DIAGNOSIS — Z7901 Long term (current) use of anticoagulants: Secondary | ICD-10-CM | POA: Diagnosis not present

## 2017-04-08 DIAGNOSIS — Z79899 Other long term (current) drug therapy: Secondary | ICD-10-CM | POA: Diagnosis not present

## 2017-04-08 DIAGNOSIS — N182 Chronic kidney disease, stage 2 (mild): Secondary | ICD-10-CM

## 2017-04-08 DIAGNOSIS — I10 Essential (primary) hypertension: Secondary | ICD-10-CM | POA: Insufficient documentation

## 2017-04-08 DIAGNOSIS — E872 Acidosis: Secondary | ICD-10-CM | POA: Diagnosis present

## 2017-04-08 LAB — I-STAT TROPONIN, ED: TROPONIN I, POC: 0.01 ng/mL (ref 0.00–0.08)

## 2017-04-08 LAB — BASIC METABOLIC PANEL
Anion gap: 12 (ref 5–15)
BUN: 19 mg/dL (ref 6–20)
CALCIUM: 8.9 mg/dL (ref 8.9–10.3)
CHLORIDE: 101 mmol/L (ref 101–111)
CO2: 22 mmol/L (ref 22–32)
CREATININE: 1.31 mg/dL — AB (ref 0.61–1.24)
GFR calc non Af Amer: 52 mL/min — ABNORMAL LOW (ref >60)
GLUCOSE: 107 mg/dL — AB (ref 65–99)
Potassium: 4 mmol/L (ref 3.5–5.1)
Sodium: 135 mmol/L (ref 135–145)

## 2017-04-08 LAB — HEPATIC FUNCTION PANEL
ALBUMIN: 3.5 g/dL (ref 3.5–5.0)
ALT: 22 U/L (ref 17–63)
AST: 23 U/L (ref 15–41)
Alkaline Phosphatase: 66 U/L (ref 38–126)
BILIRUBIN INDIRECT: 0.8 mg/dL (ref 0.3–0.9)
Bilirubin, Direct: 0.3 mg/dL (ref 0.1–0.5)
TOTAL PROTEIN: 7.3 g/dL (ref 6.5–8.1)
Total Bilirubin: 1.1 mg/dL (ref 0.3–1.2)

## 2017-04-08 LAB — I-STAT CG4 LACTIC ACID, ED
LACTIC ACID, VENOUS: 1.3 mmol/L (ref 0.5–1.9)
Lactic Acid, Venous: 3.23 mmol/L (ref 0.5–1.9)

## 2017-04-08 LAB — INFLUENZA PANEL BY PCR (TYPE A & B)
Influenza A By PCR: NEGATIVE
Influenza B By PCR: NEGATIVE

## 2017-04-08 LAB — CBC
HCT: 41.3 % (ref 39.0–52.0)
HEMOGLOBIN: 13.6 g/dL (ref 13.0–17.0)
MCH: 32.1 pg (ref 26.0–34.0)
MCHC: 32.9 g/dL (ref 30.0–36.0)
MCV: 97.4 fL (ref 78.0–100.0)
Platelets: 230 10*3/uL (ref 150–400)
RBC: 4.24 MIL/uL (ref 4.22–5.81)
RDW: 12.9 % (ref 11.5–15.5)
WBC: 13.3 10*3/uL — ABNORMAL HIGH (ref 4.0–10.5)

## 2017-04-08 LAB — D-DIMER, QUANTITATIVE (NOT AT ARMC): D DIMER QUANT: 0.45 ug{FEU}/mL (ref 0.00–0.50)

## 2017-04-08 MED ORDER — ATORVASTATIN CALCIUM 40 MG PO TABS
40.0000 mg | ORAL_TABLET | Freq: Every day | ORAL | Status: DC
  Administered 2017-04-09 – 2017-04-10 (×2): 40 mg via ORAL
  Filled 2017-04-08 (×2): qty 1

## 2017-04-08 MED ORDER — VANCOMYCIN HCL 10 G IV SOLR
1250.0000 mg | Freq: Two times a day (BID) | INTRAVENOUS | Status: DC
  Administered 2017-04-09 (×2): 1250 mg via INTRAVENOUS
  Filled 2017-04-08 (×4): qty 1250

## 2017-04-08 MED ORDER — DILTIAZEM HCL 60 MG PO TABS
30.0000 mg | ORAL_TABLET | Freq: Two times a day (BID) | ORAL | Status: AC
  Administered 2017-04-09 – 2017-04-10 (×5): 30 mg via ORAL
  Filled 2017-04-08 (×6): qty 1

## 2017-04-08 MED ORDER — SODIUM CHLORIDE 0.9 % IV SOLN
INTRAVENOUS | Status: AC
  Administered 2017-04-09: via INTRAVENOUS

## 2017-04-08 MED ORDER — SODIUM CHLORIDE 0.9 % IV BOLUS
500.0000 mL | Freq: Once | INTRAVENOUS | Status: AC
  Administered 2017-04-08: 500 mL via INTRAVENOUS

## 2017-04-08 MED ORDER — SODIUM CHLORIDE 0.9 % IV SOLN
2000.0000 mg | Freq: Once | INTRAVENOUS | Status: AC
  Administered 2017-04-09: 2000 mg via INTRAVENOUS
  Filled 2017-04-08: qty 2000

## 2017-04-08 MED ORDER — APIXABAN 5 MG PO TABS: 5.0000 mg | ORAL_TABLET | Freq: Two times a day (BID) | ORAL | Status: DC

## 2017-04-08 MED ORDER — ACETAMINOPHEN 325 MG PO TABS
650.0000 mg | ORAL_TABLET | Freq: Four times a day (QID) | ORAL | Status: DC | PRN
  Administered 2017-04-09 (×2): 650 mg via ORAL
  Filled 2017-04-08 (×2): qty 2

## 2017-04-08 MED ORDER — SODIUM CHLORIDE 0.9 % IV SOLN
1.0000 g | Freq: Three times a day (TID) | INTRAVENOUS | Status: DC
  Administered 2017-04-09 – 2017-04-10 (×4): 1 g via INTRAVENOUS
  Filled 2017-04-08 (×7): qty 1

## 2017-04-08 MED ORDER — SODIUM CHLORIDE 0.9 % IV SOLN
1.0000 g | Freq: Once | INTRAVENOUS | Status: AC
  Administered 2017-04-08: 1 g via INTRAVENOUS
  Filled 2017-04-08: qty 1

## 2017-04-08 MED ORDER — ACETAMINOPHEN 650 MG RE SUPP: 650.0000 mg | Freq: Four times a day (QID) | RECTAL | Status: DC | PRN

## 2017-04-08 MED ORDER — SODIUM CHLORIDE 0.9 % IV SOLN: 1.0000 g | Freq: Three times a day (TID) | INTRAVENOUS | Status: DC

## 2017-04-08 NOTE — ED Notes (Signed)
I stat lactic acid results given to Charge nurse and Nurse 1st.

## 2017-04-08 NOTE — ED Provider Notes (Signed)
[facility] Provider Note   CSN: [id number] Arrival date & time: 04/08/17  1819     History   Chief Complaint Chief Complaint  Patient presents with  . Chest Pain  . Cough    HPI Victor Mcclain is a 77 y.o. male.  The history is provided by the patient. No language interpreter was used.  Chest Pain   Associated symptoms include cough.  Cough  Associated symptoms include chest pain.    Victor Mcclain is a 77 y.o. male who presents to the Emergency Department complaining of weakness. He presents to the emergency department accompanied by his wife for evaluation of chest pain, shortness of breath and fever. He has been sick over the last several weeks. He was admitted to the hospital about a month ago for pneumonia and has been treated with two courses of Levaquin as well as a course of soft in here. He endorses progressive generalized weakness with a cough productive of clear sputum. Over the last week he has experienced fevers up to 102 at home. His last fever was today and was up to 100.6. He has left sided chest pain and feels like it is difficult to take a deep breath on the left side. He has nausea and poor oral intake, no vomiting, abdominal pain or diarrhea.  Past Medical History:  Diagnosis Date  . Aortic atherosclerosis (HCC) 03/21/2017   Chest CTA 03/09/17  . Cataract   . Coronary artery calcification seen on CT scan 03/21/2017   Chest CTA 03/09/17  . History of echocardiogram    Echo 3/19: Moderate LVH, EF 60-65, normal wall motion, grade 1 diastolic dysfunction, mild AI, mildly dilated aortic root (39 mm), trivial MR, PASP 18, no effusion  . History of hypertension    no meds // Diltiazem started in 3/19 for AFib  . History of stroke 01/2017   s/p ILR // AFib noted during re-hosp for pneumonia in 3/19 >> Eliquis started  . PAF (paroxysmal atrial fibrillation) (HCC)   . Stroke [facility](HCC)     Patient Active Problem List   Diagnosis Date  Noted  . Coronary artery calcification seen on CT scan 03/21/2017  . Aortic atherosclerosis (HCC) 03/21/2017  . PAF (paroxysmal atrial fibrillation) (HCC)   . HCAP (healthcare-associated pneumonia) 03/09/2017  . 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 Hyperlipidemia 01/20/2017  . Bradycardia 01/19/2017  . History of hypertension 01/19/2017  . History of BPH 01/19/2017  . Former smoker 01/19/2017    Past Surgical History:  Procedure Laterality Date  . Esophageal stretching    . LOOP RECORDER INSERTION N/A 01/22/2017   Procedure: LOOP RECORDER INSERTION;  Surgeon: Marinus Mawaylor, Gregg W, MD;  [facility];  Service: Cardiovascular;  Laterality: N/A;  . TEE WITHOUT CARDIOVERSION N/A 01/22/2017   Procedure: TRANSESOPHAGEAL ECHOCARDIOGRAM (TEE);  Surgeon: Quintella Reicherturner, Traci R, MD;  [facility];  Service: Cardiovascular;  Laterality: N/A;        Home Medications    Prior to Admission medications   Medication Sig Start Date End Date Taking? Authorizing Provider  acetaminophen (TYLENOL) 500 MG tablet Take 500-1,000 mg by mouth every 6 (six) hours as needed (for pain or headaches).     [provider]  apixaban (ELIQUIS) 5 MG TABS tablet Take 1 tablet (5 mg total) by mouth 2 (two) times daily. 03/12/17   Garth Bignessimberlake, Kathryn, MD  atorvastatin (LIPITOR)  40 MG tablet Take 1 tablet (40 mg total) by mouth daily at 6 PM. 02/22/17   Micki Riley, MD  diltiazem (CARDIZEM) 30 MG tablet Take 1 tablet (30 mg total) by mouth 2 (two) times daily. 03/21/17   Tereso Newcomer T, PA-C  glucosamine-chondroitin 500-400 MG tablet Take 1 tablet by mouth daily.    [provider]  Multiple Vitamin (MULTIVITAMIN) tablet Take 1 tablet by mouth daily.    [provider]    Family History Family History  Problem Relation Age of Onset  . Transient ischemic attack Mother   . Stroke Mother   .  Cancer Father        lung  . Cerebral aneurysm Sister   . Cancer Brother        lung  . Stroke Maternal Grandmother     Social History Social History   Tobacco Use  . Smoking status: Former Smoker    Packs/day: 3.00    Types: Cigarettes    Last attempt to quit: 01/19/1986    Years since quitting: 31.2  . Smokeless tobacco: Former Neurosurgeon    Types: Chew  Substance Use Topics  . Alcohol use: No    Frequency: Never  . Drug use: No     Allergies   Novocain [procaine]; Other; Tape; and Vancomycin   Review of Systems Review of Systems  Respiratory: Positive for cough.   Cardiovascular: Positive for chest pain.  All other systems reviewed and are negative.    Physical Exam Updated Vital Signs BP 128/83   Pulse 100   Temp (!) 101 F (38.3 C) (Rectal)   Resp (!) 30   Ht 6\' 1"  (1.854 m)   Wt 96.2 kg (212 lb)   SpO2 94%   BMI 27.97 kg/m   Physical Exam  Constitutional: He is oriented to person, place, and time. He appears well-developed and well-nourished.  HENT:  Head: Normocephalic and atraumatic.  Cardiovascular: Normal rate and regular rhythm.  No murmur heard. Pulmonary/Chest: Effort normal and breath sounds normal. No respiratory distress.  Abdominal: Soft. There is no tenderness. There is no rebound and no guarding.  Musculoskeletal: He exhibits no edema or tenderness.  Neurological: He is alert and oriented to person, place, and time.  Skin: Skin is warm and dry.  Psychiatric: He has a normal mood and affect. His behavior is normal.  Nursing note and vitals reviewed.    ED Treatments / Results  Labs (all labs ordered are listed, but only abnormal results are displayed) Labs Reviewed  BASIC METABOLIC PANEL - Abnormal; Notable for the following components:      Result Value   Glucose, Bld 107 (*)    Creatinine, Ser 1.31 (*)    GFR calc non Af Amer 52 (*)    All other components within normal limits  CBC - Abnormal; Notable for the following  components:   WBC 13.3 (*)    All other components within normal limits  I-STAT CG4 LACTIC ACID, ED - Abnormal; Notable for the following components:   Lactic Acid, Venous 3.23 (*)    All other components within normal limits  CULTURE, BLOOD (ROUTINE X 2)  CULTURE, BLOOD (ROUTINE X 2)  URINE CULTURE  INFLUENZA PANEL BY PCR (TYPE A & B)  D-DIMER, QUANTITATIVE (NOT AT [facility])  HEPATIC FUNCTION PANEL  URINALYSIS, ROUTINE W REFLEX MICROSCOPIC  I-STAT TROPONIN, ED  I-STAT CG4 LACTIC ACID, ED    EKG EKG Interpretation  Date/Time:  Sunday April 08 2017 18:24:42 EDT Ventricular Rate:  96 PR Interval:  162 QRS Duration: 138 QT Interval:  358 QTC Calculation: 452 R Axis:   -78 Text Interpretation:  Normal sinus rhythm Left axis deviation Right bundle branch block Abnormal ECG Confirmed by Tilden Fossa [phone]) on 04/08/2017 7:32:03 PM   Radiology Dg Chest 2 View  Result Date: 04/08/2017 CLINICAL DATA:  Chest pain and shortness of breath. EXAM: CHEST - 2 VIEW COMPARISON:  March 09, 2017 FINDINGS: The heart size and mediastinal contours are stable. The heart size is mildly enlarged. Both lungs are clear. The visualized skeletal structures are stable. IMPRESSION: No active cardiopulmonary disease. Electronically Signed   By: Sherian Rein M.D.   On: 04/08/2017 19:03    Procedures Procedures (including critical care time)  Medications Ordered in ED Medications  ceFEPIme (MAXIPIME) 1 g in sodium chloride 0.9 % 100 mL IVPB (has no administration in time range)  sodium chloride 0.9 % bolus 500 mL (0 mLs Intravenous Stopped 04/08/17 2056)     Initial Impression / Assessment and Plan / ED Course  I have reviewed the triage vital signs and the nursing notes.  Pertinent labs & imaging results that were available during my care of the patient were reviewed by me and considered in my medical decision making (see chart for details).     Patient here for evaluation of fever and dyspnea. He is  tachypnea on examination with clear lungs bilaterally. Chest x-ray with no evidence of acute infiltrate. CBC does note some mild leukocytosis and initial lactate was elevated at three. His lactate did clear after IV fluid administration. There is no clear source for his fever as well as lactic acidosis at this time. He was treated with empiric antibiotics pending further workup. Medicine consulted for admission for further evaluation and treatment.  Final Clinical Impressions(s) / ED Diagnoses   Final diagnoses:  None    ED Discharge Orders    None       Tilden Fossa, MD 04/08/17 2153

## 2017-04-08 NOTE — ED Triage Notes (Signed)
C/o L sided chest pain since Friday with difficulty taking a deep breath.  Reports intermittent fever x 1 week.  Taking Tylenol 1000mg  every 4-6 hours.  Productive cough with clear phlegm.  Diagnosed with pneumonia in March that still showed on x-ray 1 week ago.  Taking 3rd round of antibiotics.

## 2017-04-08 NOTE — H&P (Addendum)
[facility] Admission History and Physical Service Pager: [phone]  Patient name: Victor Mcclain Medical record number: [id number] Date of birth: 1940-03-05 Age: 77 y.o. Gender: male  Primary Care Provider: Rubie Maid, MD Consultants: None n Code Status: Full, confirmed on admission  Chief Complaint: fever  Assessment and Plan: Victor Tuttleis a 77 y.o.malepresenting with fever x1 week, s/p recent hosptialization for HAP.PMH is significant forHTN, hxischemic stroke in Jan 2019, hx afib with RVR, CKD II, aortic atherosclerosis  Fever x1 week - uncertain etiology.  Fever to 101F taken rectally in ED. Lactate initially elevated at 3.23>>1.30 with fluids. Mild leukocytosis at 13.3. Mildly tachycardic to 100 but due for dose of cardizem. Most likely source seems respiratory given history of cough and recent hospitalization with pneumonia, though no pneumonia was identified on chest x-ray this admission. Other sources of infection considered include urinary, GI, or skin, however these are lower on the differential with no corresponding symptoms. He does not have meningeal signs to think of CSF infection. Flu panel was negative.  - Admit to FPTS, attending Dr. Mingo Amber - monitor on telemetry with continuous pulse ox - cover for HAP with vancomycin and cefepime, monitor vitals and consider broadening abx if needed - mIVF x12h - Urine Legionella, strep pneumo ur Ag - RVP - mononucleosis screen - procalcitonin - Sputum culture - rule out other sources of infection with BCx, UCx  Central chest pain, non-radiating - pleuritic and worse with severe coughing or deep breaths. Pain is not exertional and does not seem to be cardiac, however unable to reproduce on exam. Considered CTA to rule out PE, however PE seems unlikely with no hypoxia or dyspnea, negative D-dimer and no missed doses of Eliquis. He does have a hx Afib with RVR, discussed below. EKG with a lot of  artifact, will repeat in AM. - continue home eliquis - monitor on telemetry with continuous pulse ox - repeat AM EKG - trend troponin  Hx Atrial fibrillation with RVR  - HR 100 on admission, does not appear to be in afib currently. He does have a loop recorder because of recent possible embolic stroke thought to be from A. fib. CHADSVASC of 4. Chest pain is worst with coughing. Istat trop in ED was negative. - continue home cardizem, 30 mg BID - continue home Eliquis 9m bid  - continue home Lipitor 40 mg daily - will monitor on telemetry  h/o ischemic stroke in January 2019. Resulting minimal left-sided deficits are noted in chart, however not picked up on this admission exam. Patient does have some slowing of cognitive processing. Patient with loop recorder as noted above. - continue home lipitor  Hypertension Per chart review without home antihypertensives. Remains normotensive. - continue to monitor BP  Chronic kidney disease stage II  - Cr of 1.31 on admission, mildly elevated compared to baseline of 1.2. Estimated Creatinine Clearance: 60.5 mL/min (A) (by C-G formula based on SCr of 1.31 mg/dL (H)). -Avoid nephrotoxic medications  Aortic arthrosclerosis Stable finding on CT. Currently on high intensity statin and anticoagulated. -Follow-up outpatient  FEN/GI: mIVF, heart healthy/renal diet Prophylaxis: on Eliquis   Disposition: admit telemetry FPTS  History of Present Illness:  Victor Bloodgoodis a 77y.o. male presenting with fever that has persisted times 1 week.  Patient was in his usual state of health until 1 month ago when he was hospitalized with pneumonia.  He was status post admission for an ischemic stroke, therefore was treated for hospital associated  pneumonia with vancomycin and cefepime.  He was discharged with Levaquin and completed his course.  Patient reports that after discharge he continued to have cough and low-grade fevers around 99.5 F.  Then, 1 week ago he  developed fevers that peaked at 102.63F.  He saw his outpatient physician who ordered a chest x-ray and then treated him for community-acquired pneumonia with azithromycin and cefepime.  He continued to take this course of antibiotics up until today, when he presented to the emergency department due to worsening fevers, poor p.o. for the past 2 days, and cough productive of clear mucus.  Patient does endorse chest pain which is worse with coughing or taking a deep breath.  He denies diarrhea, constipation, or vomiting, though has had nausea for 2 days.  He denies dysuria, urinary frequency, hematuria.  He denies any skin sores or lesions.  The patient's wife is present at bedside and helps to provide history.  Additionally she is very on top of his medications and reports that he never misses a dose of Eliquis.  Review Of Systems: Per HPI.  ROS  Patient Active Problem List   Diagnosis Date Noted  . Fever of unknown origin 04/08/2017  . Fever 04/08/2017  . Hypertension   . Coronary artery calcification seen on CT scan 03/21/2017  . Aortic atherosclerosis ([place]) 03/21/2017  . Atrial fibrillation ([place])   . HCAP (healthcare-associated pneumonia) 03/09/2017  . Stage 2 chronic kidney disease 01/21/2017  . Stroke (cerebrum) ([place]) 01/20/2017  . Hyperlipidemia 01/20/2017  . Bradycardia 01/19/2017  . History of hypertension 01/19/2017  . History of BPH 01/19/2017  . Former smoker 01/19/2017    Past Medical History: Past Medical History:  Diagnosis Date  . Aortic atherosclerosis ([place]) 03/21/2017   Chest CTA 03/09/17  . Cataract   . Coronary artery calcification seen on CT scan 03/21/2017   Chest CTA 03/09/17  . History of echocardiogram    Echo 3/19: Moderate LVH, EF 60-65, normal wall motion, grade 1 diastolic dysfunction, mild AI, mildly dilated aortic root (39 mm), trivial MR, PASP 18, no effusion  . History of hypertension    no meds // Diltiazem started in 3/19 for AFib  . History of stroke  01/2017   s/p ILR // AFib noted during re-hosp for pneumonia in 3/19 >> Eliquis started  . PAF (paroxysmal atrial fibrillation) ([place])   . Stroke [facility])     Past Surgical History: Past Surgical History:  Procedure Laterality Date  . Esophageal stretching    . LOOP RECORDER INSERTION N/A 01/22/2017   Procedure: LOOP RECORDER INSERTION;  Surgeon: Evans Lance, MD;  Location: [facility];  Service: Cardiovascular;  Laterality: N/A;  . TEE WITHOUT CARDIOVERSION N/A 01/22/2017   Procedure: TRANSESOPHAGEAL ECHOCARDIOGRAM (TEE);  Surgeon: Sueanne Margarita, MD;  Location: [facility] ENDOSCOPY;  Service: Cardiovascular;  Laterality: N/A;    Social History: Social History   Tobacco Use  . Smoking status: Former Smoker    Packs/day: 3.00    Types: Cigarettes    Last attempt to quit: 01/19/1986    Years since quitting: 31.2  . Smokeless tobacco: Former Systems developer    Types: Chew  Substance Use Topics  . Alcohol use: No    Frequency: Never  . Drug use: No   Additional social history:   Please also refer to relevant sections of EMR.  Family History: Family History  Problem Relation Age of Onset  . Transient ischemic attack Mother   . Stroke Mother   .  Cancer Father        lung  . Cerebral aneurysm Sister   . Cancer Brother        lung  . Stroke Maternal Grandmother    Allergies and Medications: Allergies  Allergen Reactions  . Novocain [Procaine] Anaphylaxis    And any similar meds  . Other Anaphylaxis    Any med ending in -caine  . Tape Other (See Comments)    Patient prefers paper tape  . Vancomycin Other (See Comments)    IV caused redness and burning in the arm; at site   No current facility-administered medications on file prior to encounter.    Current Outpatient Medications on File Prior to Encounter  Medication Sig Dispense Refill  . acetaminophen (TYLENOL) 500 MG tablet Take 500-1,000 mg by mouth every 6 (six) hours as needed (for pain or headaches).     Marland Kitchen apixaban  (ELIQUIS) 5 MG TABS tablet Take 1 tablet (5 mg total) by mouth 2 (two) times daily. 60 tablet 0  . atorvastatin (LIPITOR) 40 MG tablet Take 1 tablet (40 mg total) by mouth daily at 6 PM. 30 tablet 2  . benzonatate (TESSALON) 200 MG capsule Take 200 mg by mouth 3 (three) times daily as needed for cough.    . cefdinir (OMNICEF) 300 MG capsule Take 300 mg by mouth 2 (two) times daily. For 10 days-started on 03-31-17    . diltiazem (CARDIZEM) 30 MG tablet Take 1 tablet (30 mg total) by mouth 2 (two) times daily. (Patient taking differently: Take 30 mg by mouth 3 (three) times daily. ) 180 tablet 3  . glucosamine-chondroitin 500-400 MG tablet Take 1 tablet by mouth daily.    . Multiple Vitamin (MULTIVITAMIN) tablet Take 1 tablet by mouth daily.      Objective: BP 128/83   Pulse (!) 103   Temp (!) 101 F (38.3 C) (Rectal)   Resp (!) 26   Ht '6\' 1"'  (1.854 m)   Wt 212 lb (96.2 kg)   SpO2 93%   BMI 27.97 kg/m  Exam: General: NAD, rests comfortably in ED bed, nontoxic Eyes: PERRL, EOMI, no conjunctival pallor or injection ENTM: MMM, no pharyngeal erythema or exudate Neck: supple, no meningeal signs Cardiovascular: RRR, no mr/g/ Respiratory: +diminished breath sounds throughout, no W/R/R, +coughing, comfortable work of breathing Gastrointestinal: soft, nontendern, nondistended MSK: moves 4 extremities Derm: no skin breakdown or lesions Neuro: CN II-XII intact Psych: thought process linear, responds to questions appropriately  Labs and Imaging: CBC BMET  Recent Labs  Lab 04/08/17 1838  WBC 13.3*  HGB 13.6  HCT 41.3  PLT 230   Recent Labs  Lab 04/08/17 1838  NA 135  K 4.0  CL 101  CO2 22  BUN 19  CREATININE 1.31*  GLUCOSE 107*  CALCIUM 8.9     Flu screen negative istat troponin negative D dimer 0.45 AST 19 ALT 13 Alk phos 43  EKG with +artifact, read as NSR with RBBB  Everrett Coombe, MD 04/08/2017, 11:17 PM PGY-2, [place] Intern pager:  [phone], text pages welcome

## 2017-04-08 NOTE — Progress Notes (Addendum)
Pharmacy Antibiotic Note  Victor Mcclain is a 77 y.o. male admitted on 04/08/2017 with pneumonia.  Pharmacy has been consulted for vancomycin dosing.  Plan: Vancomycin 2000mg  x1 then 1250mg  IV every 12 hours.  Goal trough 15-20 mcg/mL. (Of note, pt lists "allergy" to vanc but more of an intolerance w/ infusion site reaction, will infuse more slowly.)  Height: 6\' 1"  (185.4 cm) Weight: 212 lb (96.2 kg) IBW/kg (Calculated) : 79.9  Temp (24hrs), Avg:99.3 F (37.4 C), Min:98.4 F (36.9 C), Max:101 F (38.3 C)  Recent Labs  Lab 04/08/17 1838 04/08/17 1850 04/08/17 2118  WBC 13.3*  --   --   CREATININE 1.31*  --   --   LATICACIDVEN  --  3.23* 1.30    Estimated Creatinine Clearance: 60.5 mL/min (A) (by C-G formula based on SCr of 1.31 mg/dL (H)).    Allergies  Allergen Reactions  . Novocain [Procaine] Anaphylaxis    And any similar meds  . Other Anaphylaxis    Any med ending in -caine  . Tape Other (See Comments)    Patient prefers paper tape  . Vancomycin Other (See Comments)    IV caused redness and burning in the arm; at site     Thank you for allowing pharmacy to be a part of this patient's care.  Victor Mcclain, PharmD, BCPS  04/08/2017 10:44 PM

## 2017-04-09 ENCOUNTER — Other Ambulatory Visit: Payer: Self-pay

## 2017-04-09 LAB — HIV ANTIBODY (ROUTINE TESTING W REFLEX): HIV Screen 4th Generation wRfx: NONREACTIVE

## 2017-04-09 LAB — PROCALCITONIN
Procalcitonin: <0.1 ng/mL
Procalcitonin: <0.1 ng/mL

## 2017-04-09 LAB — CBC
HCT: 35.5 % — ABNORMAL LOW (ref 39.0–52.0)
HEMOGLOBIN: 12.1 g/dL — AB (ref 13.0–17.0)
MCH: 32.9 pg (ref 26.0–34.0)
MCHC: 34.1 g/dL (ref 30.0–36.0)
MCV: 96.5 fL (ref 78.0–100.0)
Platelets: 233 10*3/uL (ref 150–400)
RBC: 3.68 MIL/uL — AB (ref 4.22–5.81)
RDW: 12.9 % (ref 11.5–15.5)
WBC: 11.6 10*3/uL — ABNORMAL HIGH (ref 4.0–10.5)

## 2017-04-09 LAB — RESPIRATORY PANEL BY PCR
ADENOVIRUS-RVPPCR: NOT DETECTED
Bordetella pertussis: NOT DETECTED
CORONAVIRUS NL63-RVPPCR: NOT DETECTED
CORONAVIRUS OC43-RVPPCR: NOT DETECTED
Chlamydophila pneumoniae: NOT DETECTED
Coronavirus 229E: NOT DETECTED
Coronavirus HKU1: NOT DETECTED
INFLUENZA A-RVPPCR: NOT DETECTED
INFLUENZA B-RVPPCR: NOT DETECTED
METAPNEUMOVIRUS-RVPPCR: NOT DETECTED
MYCOPLASMA PNEUMONIAE-RVPPCR: NOT DETECTED
PARAINFLUENZA VIRUS 1-RVPPCR: NOT DETECTED
PARAINFLUENZA VIRUS 3-RVPPCR: NOT DETECTED
PARAINFLUENZA VIRUS 4-RVPPCR: NOT DETECTED
Parainfluenza Virus 2: NOT DETECTED
RESPIRATORY SYNCYTIAL VIRUS-RVPPCR: NOT DETECTED
RHINOVIRUS / ENTEROVIRUS - RVPPCR: NOT DETECTED

## 2017-04-09 LAB — BASIC METABOLIC PANEL
ANION GAP: 10 (ref 5–15)
BUN: 19 mg/dL (ref 6–20)
CALCIUM: 8.1 mg/dL — AB (ref 8.9–10.3)
CO2: 21 mmol/L — AB (ref 22–32)
Chloride: 104 mmol/L (ref 101–111)
Creatinine, Ser: 1.14 mg/dL (ref 0.61–1.24)
GLUCOSE: 111 mg/dL — AB (ref 65–99)
Potassium: 3.8 mmol/L (ref 3.5–5.1)
Sodium: 135 mmol/L (ref 135–145)

## 2017-04-09 LAB — MAGNESIUM: Magnesium: 1.9 mg/dL (ref 1.7–2.4)

## 2017-04-09 LAB — TROPONIN I: Troponin I: <0.03 ng/mL (ref <0.03)

## 2017-04-09 LAB — EXPECTORATED SPUTUM ASSESSMENT W GRAM STAIN, RFLX TO RESP C

## 2017-04-09 LAB — EXPECTORATED SPUTUM ASSESSMENT W REFEX TO RESP CULTURE

## 2017-04-09 LAB — PHOSPHORUS: Phosphorus: 2.6 mg/dL (ref 2.5–4.6)

## 2017-04-09 LAB — MONONUCLEOSIS SCREEN: Mono Screen: NEGATIVE

## 2017-04-09 MED ORDER — NON FORMULARY: Freq: Once | Status: DC | PRN

## 2017-04-09 MED ORDER — MELATONIN 3 MG PO TABS
3.0000 mg | ORAL_TABLET | Freq: Once | ORAL | Status: AC | PRN
  Administered 2017-04-10: 3 mg via ORAL
  Filled 2017-04-09 (×2): qty 1

## 2017-04-09 MED ORDER — APIXABAN 5 MG PO TABS
5.0000 mg | ORAL_TABLET | Freq: Two times a day (BID) | ORAL | Status: DC
  Administered 2017-04-09 – 2017-04-11 (×6): 5 mg via ORAL
  Filled 2017-04-09 (×6): qty 1

## 2017-04-09 MED ORDER — DM-GUAIFENESIN ER 30-600 MG PO TB12
1.0000 | ORAL_TABLET | Freq: Two times a day (BID) | ORAL | Status: DC | PRN
  Administered 2017-04-09 (×2): 1 via ORAL
  Filled 2017-04-09 (×2): qty 1

## 2017-04-09 NOTE — ED Notes (Signed)
Patient ambulated to restroom and back with steady gait. NAD noted.

## 2017-04-09 NOTE — ED Notes (Signed)
Sent request to pharmacy to verify medications

## 2017-04-09 NOTE — Progress Notes (Addendum)
Family Medicine Teaching Service Daily Progress Note Intern Mcclain: [phone]  Patient name: Victor Mcclain Medical record number: [id number] Date of birth: Jan 20, 1940 Age: 77 y.o. Gender: male  Primary Care Provider: Tarri Fuller, MD Consultants: None Code Status: Full  Pt Overview and Major Events to Date:  Victor Mcclain a 77 y.o.malepresenting with fever x1 week, s/p recent hosptialization for HAP. Admitted to telemetry due to Hx of Afib.  Assessment and Plan: Victor Mcclain a 76 y.o.malepresenting with fever x1 week, s/p recent hosptialization for HAP.  PMH is significant forHTN, hxischemic stroke in Jan 2019, hx afib with RVR, CKD II, aortic atherosclerosis  FUO x1 week - uncertain etiology and pending workup.  Last fever 9 PM 4/7, patient has been afebrile overnight.  Fever to 101F taken rectally in ED. Lactate initially elevated at 3.23, improved to 1.30 with fluids. Mild leukocytosis at 13.3 also downtrending to 11.6 with AM labs.  Initially mildly tachycardic to 100 but due for dose of cardizem. This has since resolved and pt with stable vitals on exam this morning.  Most likely source seems respiratory given history of cough and recent hospitalization with pneumonia, though no pneumonia was identified on CXR this admission. Other sources of infection considered include urinary, GI, or skin, however these are lower on the differential with no corresponding symptoms. He does not have meningeal signs to think of CSF infection.  Flu panel and monospot negative.  Procalcitonin <0.01 X2 making bacterial infection less likely.   RVP negative.   - monitor on telemetry with continuous pulse ox  - Continue abx for HAP with vancomycin and cefepime, pending cx and sensitivities  - Urine Legionella , strep pneumo ur Ag pending - Sputum, blood, and urine cultures pending - If pt continues to be febrile 4/9, obtain CT chest for lung pathology before consulting ID  Central chest pain,  non-radiating - pleuritic and worse with severe coughing or deep breaths. Pain is not exertional and does not seem to be cardiac, however unable to reproduce on exam. Considered CTA to rule out PE, however PE seems unlikely with no hypoxia or dyspnea, negative D-dimer and no missed doses of Eliquis. He does have a hx Afib with RVR, discussed below. Initial EKG with a lot of artifact and repeat EKG ordered this morning.   - continue home eliquis - monitor on tele with continuous pulse ox - f/u AM EKG - trend troponin: <0.03 x2 this morning  Hx Atrial fibrillation with RVR  - HR 100 on admission, currently rate controlled with HR 75-80.   He does have a loop recorder because of recent possible embolic stroke thought to be from A. fib.CHADSVASC of 4.Chest pain is worst with coughing. Istat trop in ED was negative and subsequent troponin also negative.   - continue home cardizem, 30 mg BID - continuehome Eliquis5mg  bid  - continue home Lipitor 40 mg daily - monitor on tele  H/o ischemic stroke in January 2019. Resulting minimal left-sided deficits are noted in chart, however not picked up on this admission exam. Patient does have some slowing of cognitive processing. Patient with loop recorder as noted above. - continue home lipitor  Hypertension Per chart review controlled without home antihypertensives. Remains normotensive this morning with BP 118/73.  - continue to monitor BP  Chronic kidney disease stage II  - Cr of 1.31 on admission, improved to 1.14 on AM labs.   Baseline appears to be 1.2. Estimated Creatinine Clearance: 60.5 mL/min (A) (by C-G formula based  on SCr of 1.31 mg/dL (H)). -Daily BMP -Avoid nephrotoxic medications  Aortic arthrosclerosis Stable finding on CT. Currently on high intensity statin and anticoagulated. -Continue Lipitor 40 mg daily  -Follow-up outpatient  FEN/GI: mIVF, heart healthy  Prophylaxis: on Eliquis   Disposition: Dispo pending workup for  fever of unknown origin   Subjective:  Patient has had no acute events overnight. Has remained afebrile since 9 PM.  He c/o increased cough, productive of half a cup of clear sputum, per patient. He denies chills, nausea, vomiting. No chest pain, SOB or dysuria.   He endorses one episode of loose stool since admission.  Sister at bedside.    Objective: Temp:  [98.4 F (36.9 C)-101 F (38.3 C)] 98.4 F (36.9 C) (04/08 1131) Pulse Rate:  [70-103] 75 (04/08 1131) Resp:  [9-30] 18 (04/08 1131) BP: (109-136)/(71-99) 120/73 (04/08 1131) SpO2:  [91 %-98 %] 96 % (04/08 1131) FiO2 (%):  [2 %] 2 % (04/08 1015) Weight:  [212 lb (96.2 kg)] 212 lb (96.2 kg) (04/07 1828)   Physical Exam: General: pleasant 77 yo male laying in bed, appears comfortable, NAD, nasal cannula in place   Cardiovascular: RRR, no MRG  Respiratory: Clear to auscultation bilaterally, no wheeze present, normal effort on 1L per [place]  Abdomen: Non-distended, non-tender to palpation in all quadrants  Extremities: No edema or cyanosis Psych: normal mood and affect   Laboratory: Recent Labs  Lab 04/08/17 1838 04/09/17 0416  WBC 13.3* 11.6*  HGB 13.6 12.1*  HCT 41.3 35.5*  PLT 230 233   Recent Labs  Lab 04/08/17 1838 04/08/17 1940 04/09/17 0416  NA 135  --  135  K 4.0  --  3.8  CL 101  --  104  CO2 22  --  21*  BUN 19  --  19  CREATININE 1.31*  --  1.14  CALCIUM 8.9  --  8.1*  PROT  --  7.3  --   BILITOT  --  1.1  --   ALKPHOS  --  66  --   ALT  --  22  --   AST  --  23  --   GLUCOSE 107*  --  111*    Imaging/Diagnostic Tests:  Dg Chest 2 View  Result Date: 04/08/2017 CLINICAL DATA:  Chest pain and shortness of breath. EXAM: CHEST - 2 VIEW COMPARISON:  March 09, 2017 FINDINGS: The heart size and mediastinal contours are stable. The heart size is mildly enlarged. Both lungs are clear. The visualized skeletal structures are stable. IMPRESSION: No active cardiopulmonary disease. Electronically Signed   By:  Sherian ReinWei-Chen  Lin M.D.   On: 04/08/2017 19:03    Victor Mcclain, Victor Polyakov, MD 04/09/2017, 3:09 PM Victor Mcclain: [phone], text pages welcome

## 2017-04-09 NOTE — ED Notes (Signed)
Breakfast tray ordered. Heart healthy/carb modified 

## 2017-04-10 DIAGNOSIS — R509 Fever, unspecified: Secondary | ICD-10-CM

## 2017-04-10 LAB — CBC
HCT: 33.2 % — ABNORMAL LOW (ref 39.0–52.0)
Hemoglobin: 10.6 g/dL — ABNORMAL LOW (ref 13.0–17.0)
MCH: 30.9 pg (ref 26.0–34.0)
MCHC: 31.9 g/dL (ref 30.0–36.0)
MCV: 96.8 fL (ref 78.0–100.0)
PLATELETS: 230 10*3/uL (ref 150–400)
RBC: 3.43 MIL/uL — ABNORMAL LOW (ref 4.22–5.81)
RDW: 12.7 % (ref 11.5–15.5)
WBC: 10.1 10*3/uL (ref 4.0–10.5)

## 2017-04-10 LAB — BASIC METABOLIC PANEL
Anion gap: 9 (ref 5–15)
Anion gap: 8 (ref 5–15)
BUN: 16 mg/dL (ref 6–20)
BUN: 14 mg/dL (ref 6–20)
CALCIUM: 8.1 mg/dL — AB (ref 8.9–10.3)
CHLORIDE: 105 mmol/L (ref 101–111)
CHLORIDE: 103 mmol/L (ref 101–111)
CO2: 22 mmol/L (ref 22–32)
CO2: 24 mmol/L (ref 22–32)
CREATININE: 1.06 mg/dL (ref 0.61–1.24)
CREATININE: 1.18 mg/dL (ref 0.61–1.24)
Calcium: 8.2 mg/dL — ABNORMAL LOW (ref 8.9–10.3)
GFR calc Af Amer: >60 mL/min (ref >60)
GFR calc Af Amer: >60 mL/min (ref >60)
GFR calc non Af Amer: >60 mL/min (ref >60)
GFR calc non Af Amer: 59 mL/min — ABNORMAL LOW (ref >60)
Glucose, Bld: 100 mg/dL — ABNORMAL HIGH (ref 65–99)
Glucose, Bld: 99 mg/dL (ref 65–99)
Potassium: 3.6 mmol/L (ref 3.5–5.1)
Potassium: 3.7 mmol/L (ref 3.5–5.1)
SODIUM: 136 mmol/L (ref 135–145)
SODIUM: 135 mmol/L (ref 135–145)

## 2017-04-10 LAB — URINALYSIS, ROUTINE W REFLEX MICROSCOPIC
BILIRUBIN URINE: NEGATIVE
Glucose, UA: NEGATIVE mg/dL
HGB URINE DIPSTICK: NEGATIVE
Ketones, ur: 5 mg/dL — AB
Leukocytes, UA: NEGATIVE
Nitrite: NEGATIVE
Protein, ur: NEGATIVE mg/dL
SPECIFIC GRAVITY, URINE: 1.021 (ref 1.005–1.030)
pH: 5 (ref 5.0–8.0)

## 2017-04-10 LAB — STREP PNEUMONIAE URINARY ANTIGEN: STREP PNEUMO URINARY ANTIGEN: NEGATIVE

## 2017-04-10 LAB — PROCALCITONIN: Procalcitonin: <0.1 ng/mL

## 2017-04-10 LAB — GLUCOSE, CAPILLARY: Glucose-Capillary: 105 mg/dL — ABNORMAL HIGH (ref 65–99)

## 2017-04-10 MED ORDER — DILTIAZEM HCL ER COATED BEADS 120 MG PO CP24
120.0000 mg | ORAL_CAPSULE | Freq: Every day | ORAL | Status: DC
  Administered 2017-04-11: 120 mg via ORAL
  Filled 2017-04-10: qty 1

## 2017-04-10 MED ORDER — DOXYCYCLINE HYCLATE 100 MG PO TABS
100.0000 mg | ORAL_TABLET | Freq: Two times a day (BID) | ORAL | Status: DC
  Administered 2017-04-10 – 2017-04-11 (×3): 100 mg via ORAL
  Filled 2017-04-10 (×3): qty 1

## 2017-04-10 NOTE — Progress Notes (Addendum)
Family Medicine Teaching Service Daily Progress Note Intern Pager: [phone]  Patient name: Victor Mcclain Medical record number: [id number] Date of birth: [birth year] Age: 77 y.o. Gender: male  Primary Care Provider: Tarri Fuller, MD Consultants: None Code Status: Full  Pt Overview and Major Events to Date:  Victor Tuttleis a 76 y.o.malepresenting withfever x1 week, s/p recent hosptialization for HAP. Admitted 4/7 to telemetry due to Hx of Afib.   Assessment and Plan: Victor Tuttleis a 76 y.o.malepresenting withfever x1 week, s/p recent hosptialization for HAP.  PMH is significant forHTN, hxischemic stroke in Jan 2019, hx afib with RVR, CKD II, aortic atherosclerosis  FUO x1 week- uncertain etiology and pending workup.Afebrile overnight. Leukocytosis resolved. Thought to be pulmonary etiology due to recent HAP and cough on admit, so treating as HAP. CXR neg. Other workup including flu, mono, RVP negative. BCx NGx24H. UCx not done, UA collected. No meningeal signs. Normal skin exam. - s/p 2d IV vanc and cefepime (4/7 - 4/8 ) >>Levaquin (4/9 - )  -Urine Legionella , strep pneumo ur Ag pending -Sputum Cx, UA pending - monitor fever curve on PO abx  Central chest pain, non-radiating- Resolved. Initial EKG with a lot of artifact. EKG 4/8 negative for ischemic changes. Neg trops. - continue home eliquis - monitor on telemetry with continuous pulse ox  HxAtrial fibrillation with RVR- Rate controlled with HR 75-80.   He does havea loop recorder because of recentpossible embolic stroke thought to be from A. fib.CHADSVASC of4.Chest pain is worst with coughing. - home cardizem, 30 mg BID >>> QID would be appropriate dosing for short acting. Plan to transition to 120 mg Dilt tomorrow AM - continuehomeEliquis5mg  bid  - continue home Lipitor 40 mg daily - monitor on telemetry  H/o ischemic strokein January 2019. Resulting minimal left-sided deficits. Patient does  have some slowing of cognitive processing. Patient with loop recorder as noted above. - continue home lipitor  HypertensionPer chart review controlled without home antihypertensives. Remains normotensive this morning with BP 122/78.  - continue to monitor BP  Chronic kidney disease stage II- Cr of 1.31 on admission, improved to 1.06 on AM labs.   Baseline appears to be 1.2. Estimated Creatinine Clearance: 67 mL/min (by C-G formula based on SCr of 1.18 mg/dL).] -Avoid nephrotoxic medications  Aortic arthrosclerosis Stable finding on CT. Currently on high intensity statin and anticoagulated. -Continue Lipitor 40 mg daily  -Follow-up outpatient  FEN/GI:mIVF, heart healthy  Prophylaxis:on Eliquis  Disposition: Dispo pending workup for fever of unknown origin   Subjective:  No acute events overnight. Patient continues to have a cough productive of clear sputum, quantified at about one teaspoon total. He denies chest pain, shortness of breath, subjective fevers, chills, nausea, vomiting, abdominal pain, bowel or urinary changes.   Objective: Temp:  [98.4 F (36.9 C)-99.4 F (37.4 C)] 99 F (37.2 C) (04/09 0527) Pulse Rate:  [74-88] 85 (04/09 0527) Resp:  [18-28] 18 (04/08 1632) BP: (109-133)/(64-86) 112/64 (04/09 0527) SpO2:  [92 %-98 %] 93 % (04/09 0527) FiO2 (%):  [2 %] 2 % (04/08 1015) Weight:  [211 lb 10.3 oz (96 kg)] 211 lb 10.3 oz (96 kg) (04/09 0527) Physical Exam: General: NAD, well-appearing Cardiovascular: RRR, no RMG Respiratory: Clear to auscultation bilaterally Abdomen: Non-tender, non-distended Extremities: No edema or cyanosis  Laboratory: Recent Labs  Lab 04/08/17 1838 04/09/17 0416  WBC 13.3* 11.6*  HGB 13.6 12.1*  HCT 41.3 35.5*  PLT 230 233   Recent Labs  Lab 04/08/17 1838 04/08/17 1940  04/09/17 0416  NA 135  --  135  K 4.0  --  3.8  CL 101  --  104  CO2 22  --  21*  BUN 19  --  19  CREATININE 1.31*  --  1.14  CALCIUM 8.9  --   8.1*  PROT  --  7.3  --   BILITOT  --  1.1  --   ALKPHOS  --  66  --   ALT  --  22  --   AST  --  23  --   GLUCOSE 107*  --  111*     Imaging/Diagnostic Tests: No results found.   Eual Finesike, [place] E, Medical Student 04/10/2017, 7:47 AM  Family Medicine FPTS Intern pager: [phone], text pages welcome  I have separately seen and examined the patient. I have discussed the findings and exam with the medical student and I have edited and added to the above note.  EYE: no conjunctival injection RESPIRATORY: CTA bil, distant breath sounds, decreased breath sounds bil bases CV: RRR, no m/r/g, no peripheral edema  GI: Soft, non-tender, non-distended SKIN: warm and dry, no rashes or lesions NEURO: II-XII grossly intact PSYCH: AAO, appropriate affect  Howard PouchLauren Rush Salce, MD PGY-2 [facility]

## 2017-04-11 ENCOUNTER — Inpatient Hospital Stay (HOSPITAL_COMMUNITY): Payer: Medicare HMO

## 2017-04-11 ENCOUNTER — Telehealth: Payer: Self-pay

## 2017-04-11 LAB — CBC
HCT: 33.4 % — ABNORMAL LOW (ref 39.0–52.0)
HEMOGLOBIN: 10.9 g/dL — AB (ref 13.0–17.0)
MCH: 31.1 pg (ref 26.0–34.0)
MCHC: 32.6 g/dL (ref 30.0–36.0)
MCV: 95.4 fL (ref 78.0–100.0)
Platelets: 270 10*3/uL (ref 150–400)
RBC: 3.5 MIL/uL — ABNORMAL LOW (ref 4.22–5.81)
RDW: 12.5 % (ref 11.5–15.5)
WBC: 9.5 10*3/uL (ref 4.0–10.5)

## 2017-04-11 LAB — LEGIONELLA PNEUMOPHILA SEROGP 1 UR AG: L. PNEUMOPHILA SEROGP 1 UR AG: NEGATIVE

## 2017-04-11 MED ORDER — DILTIAZEM HCL ER COATED BEADS 120 MG PO CP24: 120.0000 mg | ORAL_CAPSULE | Freq: Every day | ORAL | 0 refills | Status: AC

## 2017-04-11 MED ORDER — DOXYCYCLINE HYCLATE 100 MG PO TABS: 100.0000 mg | ORAL_TABLET | Freq: Two times a day (BID) | ORAL | 0 refills | Status: AC

## 2017-04-11 MED ORDER — DM-GUAIFENESIN ER 30-600 MG PO TB12: 1.0000 | ORAL_TABLET | Freq: Two times a day (BID) | ORAL | 0 refills | Status: AC | PRN

## 2017-04-11 NOTE — Progress Notes (Addendum)
Family Medicine Teaching Service Daily Progress Note Intern Pager: [phone]  Patient name: Victor Mcclain Medical record number: [id number] Date of birth: 1940/04/07 Age: 77 y.o. Gender: male  Primary Care Provider: Tarri Fuller, MD Consultants: None Code Status: Full  Pt Overview and Major Events to Date:  Admitted 4/7  Assessment and Plan: Victor Tuttleis a 76 y.o.malepresenting withfever x1 week, s/p recent hosptialization for HAP. PMH is significant forHTN, hxischemic stroke in Jan 2019, hx afib with RVR, CKD II, aortic atherosclerosis.  Fever x1 week, treating as HAP- Afebrile overnight on doxycycline.Leukocytosis resolved. SLP bedside swallow study  determined moderate aspiration risk.. Modified barium swallow study showed normal swallow function, motor and sensory sufficient to prevent penetration, aspiration, and pharyngeal residue.  - s/p 2d IV vanc and cefepime (4/7 - 4/8 )  - On PO doxycycline (4/9-4/15 ) - SLP recs: regular texture diet, thin liquids, and pills with liquids  Central chest pain, non-radiating- Resolved. InitialEKG with a lot of artifact.EKG 4/8 negative for ischemic changes. Neg trops. - continue home eliquis - monitor on telemetry with continuous pulse ox  HxAtrial fibrillation with RVR- Rate controlled with HR 77-85.He does havea loop recorder because of recentpossible embolic stroke thought to be from A. fib.CHADSVASC of4.Chest pain is worst with coughing. Home cardizem 30 mg BID. - Diltiazem 120 mg qd (changed this admission) - continuehomeEliquis5mg  bid  - continue home Lipitor 40 mg daily -monitor on telemetry  H/o ischemic strokein January 2019. Resulting minimal left-sided deficits. Patient does have some slowing of cognitive processing. Patient with loop recorder as noted above. - continue home lipitor  HypertensionPer chart reviewcontrolledwithout home antihypertensives. Remains normotensive this morning  with BP 122/82. - continue to monitor BP  Chronic kidney disease stage II- Patient nearbaselineappears to be1.2. Estimated Creatinine Clearance: 67 mL/min (by C-G formula based on SCr of 1.18 mg/dL).] -Avoid nephrotoxic medications  Aortic arthrosclerosis Stable finding on CT. Currently on high intensity statin and anticoagulated. -Continue Lipitor 40 mg daily -Follow-up outpatient  FEN/GI:mIVF, heart healthy Prophylaxis:on Eliquis  Disposition:Discharge home today  Subjective:  No acute events overnight. Patient has continued cough productive of clear sputum. He denies chest pain, shortness of breath, sore throat, fever, chills, nausea, vomiting, abdominal pain, bowel or urinary changes.  Objective: Temp:  [98.2 F (36.8 C)-98.7 F (37.1 C)] 98.2 F (36.8 C) (04/09 2129) Pulse Rate:  [77-85] 78 (04/10 0506) Resp:  [18-20] 18 (04/10 0506) BP: (110-125)/(60-84) 115/60 (04/10 0506) SpO2:  [93 %-96 %] 93 % (04/10 0506) Weight:  [211 lb 10.4 oz (96 kg)] 211 lb 10.4 oz (96 kg) (04/09 2129) Physical Exam: General: NAD, well-appearing Cardiovascular: RRR, no RMG Respiratory:  Clear to auscultation bilaterally Abdomen: Non-tender, non-distended Extremities: No edema or cyanosis  Laboratory: Recent Labs  Lab 04/09/17 0416 04/10/17 0718 04/11/17 0428  WBC 11.6* 10.1 9.5  HGB 12.1* 10.6* 10.9*  HCT 35.5* 33.2* 33.4*  PLT 233 230 270   Recent Labs  Lab 04/08/17 1940 04/09/17 0416 04/10/17 0718 04/10/17 1155  NA  --  135 136 135  K  --  3.8 3.6 3.7  CL  --  104 105 103  CO2  --  21* 22 24  BUN  --  19 16 14   CREATININE  --  1.14 1.06 1.18  CALCIUM  --  8.1* 8.1* 8.2*  PROT 7.3  --   --   --   BILITOT 1.1  --   --   --   ALKPHOS 66  --   --   --  ALT 22  --   --   --   AST 23  --   --   --   GLUCOSE  --  111* 100* 99     Imaging/Diagnostic Tests:   Eual Finesike, [place] E, Medical Student 04/11/2017, 8:02 AM [facility] FPTS Intern  pager: [phone], text pages welcome  I have separately seen and examined the patient. I have discussed the findings and exam with the medical student and agree with the above note, which I helped to write and edit.  EYE: no conjunctival injection, or pallor RESPIRATORY: clear to auscultation bilaterally with no wheezes, rhonchi or rales, good effort CV: RRR, no m/r/g, no peripheral edema GI: Soft, non-tender, non-distended SKIN: warm and dry, no rashes or lesions NEURO: II-XII grossly intact PSYCH: AAOx3, appropriate affect  Howard PouchLauren Edye Hainline, MD PGY-2 [facility]

## 2017-04-11 NOTE — Discharge Summary (Addendum)
Family Medicine Teaching [facility] Discharge Summary  Patient name: Victor Mcclain Medical record number: [id number] Date of birth: 05-29-1940 Age: 77 y.o. Gender: male Date of Admission: 04/08/2017  Date of Discharge: 04/11/17 Admitting Physician: Howard PouchLauren Jonpaul Lumm, MD  Primary Care Provider: Tarri FullerEscajeda, Richard, MD Consultants: None  Indication for Hospitalization: Fever of unknown origin  Discharge Diagnoses/Problem List:  Patient Active Problem List   Diagnosis Date Noted  . Fever of unknown origin 04/08/2017  . Fever 04/08/2017  . Hypertension   . Coronary artery calcification seen on CT scan 03/21/2017  . Aortic atherosclerosis (HCC) 03/21/2017  . Atrial fibrillation (HCC)   . HCAP (healthcare-associated pneumonia) 03/09/2017  . Stage 2 chronic kidney disease 01/21/2017  . Hyperlipidemia 01/20/2017  . Bradycardia 01/19/2017  . History of hypertension 01/19/2017  . History of BPH 01/19/2017  . Former smoker 01/19/2017    Disposition: Discharge home  Discharge Condition: Stable  Discharge Exam: General: NAD, well-appearing Cardiovascular: RRR, no RMG Respiratory:  Clear to auscultation bilaterally Abdomen: Non-tender, non-distended Extremities: No edema or cyanosis  Brief Hospital Course:  Patient presented to ED on 4/7 for 1 week history of fevers, associated with pleuritic chest pain and cough productive of white sputum. He was previously hospitalized two months ago for an ischemic stroke, and one month ago for healthcare acquired pneumonia. Subjective fevers persisted and patient was treated for CAP by PCP with no improvement. He denied symptoms suggestive of GI, GU, or skin foci of infection. Chest pain was described as worsened with cough and deep breathing. EKG was negative for ischemic changes and troponins trended negative.  Patient was admitted to telemetry due to Hx of Afib with RVR. Fevers were presumed to be of pulmonary origin given productive cough. Treatment  for HAP was initiated with IV vancomycin and cefepime. Pertinent negative labs: procalcitonin, U/A, RVP, blood culture x48hr. On day 2 of vanc/cefepime, patient was persistently afebrile, with resolved leukocytosis, and reduced sputum production. Antibiotic therapy was de-escalated to PO doxycycline on day 3 of hospitalization and was well-tolerated with no return of fevers. Family members noted increased cough following oral intake. Speech pathology was consulted for swallow study concerning for possible aspiration. Patient was determined to have moderate aspiration risk at bedside. Modified barium swallow showed normal swallow function, motor and sensory sufficient to prevent penetration, aspiration, and pharyngeal residue, classified as mild aspiration risk.   Issues for Follow Up:  1. Ensure completion of full course of Doxycycline 100 mg q12hr x 6 days 2. Determine improvement of cough on PRN Mucinex 30-600 mg  3. Monitor for recurrent fevers, worsening cough, and chest pain 4. Mild aspiration risk. Discuss recommended diet: regular, thin liquids, and liquids with pills. 5. Diltiazem prescription changed from 30 mg BID to 120 mg qdaily. Continue 120 mg dosing outpatient.  Significant Procedures: none  Significant Labs and Imaging:  Recent Labs  Lab 04/09/17 0416 04/10/17 0718 04/11/17 0428  WBC 11.6* 10.1 9.5  HGB 12.1* 10.6* 10.9*  HCT 35.5* 33.2* 33.4*  PLT 233 230 270   Recent Labs  Lab 04/08/17 1838 04/08/17 1940 04/08/17 2228 04/09/17 0416 04/10/17 0718 04/10/17 1155  NA 135  --   --  135 136 135  K 4.0  --   --  3.8 3.6 3.7  CL 101  --   --  104 105 103  CO2 22  --   --  21* 22 24  GLUCOSE 107*  --   --  111* 100* 99  BUN 19  --   --  19 16 14   CREATININE 1.31*  --   --  1.14 1.06 1.18  CALCIUM 8.9  --   --  8.1* 8.1* 8.2*  MG  --   --  1.9  --   --   --   PHOS  --   --  2.6  --   --   --   ALKPHOS  --  66  --   --   --   --   AST  --  23  --   --   --   --   ALT   --  22  --   --   --   --   ALBUMIN  --  3.5  --   --   --   --    Recent Results (from the past 240 hour(s))  Culture, blood (routine x 2)     Status: None (Preliminary result)   Collection Time: 04/08/17  7:17 PM  Result Value Ref Range Status   Specimen Description BLOOD RIGHT ANTECUBITAL  Final   Special Requests   Final    BOTTLES DRAWN AEROBIC AND ANAEROBIC Blood Culture adequate volume   Culture   Final    NO GROWTH 2 DAYS Performed at [facility], [address], [place], [place] [address]    Report Status PENDING  Incomplete  Culture, blood (routine x 2)     Status: None (Preliminary result)   Collection Time: 04/08/17  7:40 PM  Result Value Ref Range Status   Specimen Description BLOOD BLOOD RIGHT HAND  Final   Special Requests IN PEDIATRIC BOTTLE Blood Culture adequate volume  Final   Culture   Final    NO GROWTH 2 DAYS Performed at [facility], [address], [place], [place] [address]    Report Status PENDING  Incomplete  Culture, sputum-assessment     Status: None   Collection Time: 04/09/17 10:55 AM  Result Value Ref Range Status   Specimen Description SPUTUM  Final   Special Requests NONE  Final   Sputum evaluation   Final    Sputum specimen not acceptable for testing.  Please recollect.   Gram Stain Report Called to,Read Back By and Verified With: T MOORE,RN AT 1249 04/09/17 BY L BENFIELD Performed at [facility], [address] [place], [place] [address]    Report Status 04/09/2017 FINAL  Final  Respiratory Panel by PCR     Status: None   Collection Time: 04/09/17 10:55 AM  Result Value Ref Range Status   Adenovirus NOT DETECTED NOT DETECTED Final   Coronavirus 229E NOT DETECTED NOT DETECTED Final   Coronavirus HKU1 NOT DETECTED NOT DETECTED Final   Coronavirus NL63 NOT DETECTED NOT DETECTED Final   Coronavirus OC43 NOT DETECTED NOT DETECTED Final   Metapneumovirus NOT DETECTED NOT DETECTED Final   Rhinovirus / Enterovirus NOT  DETECTED NOT DETECTED Final   Influenza A NOT DETECTED NOT DETECTED Final   Influenza B NOT DETECTED NOT DETECTED Final   Parainfluenza Virus 1 NOT DETECTED NOT DETECTED Final   Parainfluenza Virus 2 NOT DETECTED NOT DETECTED Final   Parainfluenza Virus 3 NOT DETECTED NOT DETECTED Final   Parainfluenza Virus 4 NOT DETECTED NOT DETECTED Final   Respiratory Syncytial Virus NOT DETECTED NOT DETECTED Final   Bordetella pertussis NOT DETECTED NOT DETECTED Final   Chlamydophila pneumoniae NOT DETECTED NOT DETECTED Final   Mycoplasma pneumoniae NOT DETECTED NOT DETECTED Final  Comment: Performed at [facility], [address], [place], [place] [address]    Results/Tests Pending at Time of Discharge:  Urine Legionella Ag, Strep pneumo Ag Sputum culture  Discharge Medications:  Allergies as of 04/11/2017      Reactions   Novocain [procaine] Anaphylaxis   And any similar meds   Other Anaphylaxis   Any med ending in -caine   Tape Other (See Comments)   Patient prefers paper tape   Vancomycin Other (See Comments)   IV caused redness and burning in the arm; at site      Medication List    STOP taking these medications   acetaminophen 500 MG tablet Commonly known as:  TYLENOL   benzonatate 200 MG capsule Commonly known as:  TESSALON   cefdinir 300 MG capsule Commonly known as:  OMNICEF   diltiazem 30 MG tablet Commonly known as:  CARDIZEM     TAKE these medications   apixaban 5 MG Tabs tablet Commonly known as:  ELIQUIS Take 1 tablet (5 mg total) by mouth 2 (two) times daily.   atorvastatin 40 MG tablet Commonly known as:  LIPITOR Take 1 tablet (40 mg total) by mouth daily at 6 PM.   dextromethorphan-guaiFENesin 30-600 MG 12hr tablet Commonly known as:  MUCINEX DM Take 1 tablet by mouth 2 (two) times daily as needed for cough.   diltiazem 120 MG 24 hr capsule Commonly known as:  CARDIZEM CD Take 1 capsule (120 mg total) by mouth daily. Start taking on:   04/12/2017   doxycycline 100 MG tablet Commonly known as:  VIBRA-TABS Take 1 tablet (100 mg total) by mouth every 12 (twelve) hours for 6 days.   glucosamine-chondroitin 500-400 MG tablet Take 1 tablet by mouth daily.   multivitamin tablet Take 1 tablet by mouth daily.       Discharge Instructions: Please refer to Patient Instructions section of EMR for full details.  Patient was counseled important signs and symptoms that should prompt return to medical care, changes in medications, dietary instructions, activity restrictions, and follow up appointments.   Follow-Up Appointments:   Eual Fines, Medical Student 04/11/2017, 1:46 PM [facility]   Howard Pouch, MD PGY-2 [facility]

## 2017-04-11 NOTE — Progress Notes (Signed)
Pt. discharged to home with his sister. Pt after visit summary reviewed and pt capable of re verbalizing medications and follow up appointments. Pt remains stable. No signs and symptoms of distress. Educated to return to ER in the event of SOB, dizziness, chest pain, or fainting. Burley SaverKami Zanyah Lentsch, RN

## 2017-04-11 NOTE — Discharge Instructions (Signed)

## 2017-04-11 NOTE — Progress Notes (Signed)
Modified Barium Swallow Progress Note  Patient Details  Name: Victor Mcclain MRN: [id number] Date of Birth: Oct 06, 1940  Today's Date: 04/11/2017  Modified Barium Swallow completed.  Full report located under Chart Review in the Imaging Section.  Brief recommendations include the following:  Clinical Impression  Pt demonstrated normal oropharyngeal swallow function. Adequate lingual cohesion, control and transit with one instance of lingual residue spilling to valleculae and pyriform sinuses. Motor and sensory aspects of swallow were sufficient to prevent penetration, aspiration and pharyngeal residue. No difficulty with bolus entrance into cervical esophagus and although not diagnosed during an MBS, pill transited esophagus timely and entered GE junction. Coughing noted at end of study (fluoro turned off) as reported by family. Recommend continue regular texture, thin liquids, pills with liquids.    Swallow Evaluation Recommendations       SLP Diet Recommendations: Regular solids;Thin liquid   Liquid Administration via: Cup;Straw   Medication Administration: Whole meds with liquid   Supervision: Patient able to self feed   Compensations: Slow rate;Small sips/bites   Postural Changes: Seated upright at 90 degrees;Remain semi-upright after after feeds/meals (Comment)   Oral Care Recommendations: Oral care BID        Royce MacadamiaLitaker, Westly Hinnant Willis 04/11/2017,11:19 AM   Breck CoonsLisa Willis Lonell FaceLitaker M.Ed [employer] Pager [phone]

## 2017-04-11 NOTE — Telephone Encounter (Signed)
-----   Message from Micki RileyPramod S Sethi, MD sent at 04/11/2017 12:15 PM EDT ----- Victor RoachKindly advise patient that lab work for reversible causes of memory loss were all unremarkable

## 2017-04-11 NOTE — Evaluation (Signed)
Clinical/Bedside Swallow Evaluation Patient Details  Name: Kevan NyJetty Veneziano MRN: [id number] Date of Birth: [birth year]  Today's Date: 04/11/2017 Time: SLP Start Time (ACUTE ONLY): [id number] SLP Stop Time (ACUTE ONLY): 0855 SLP Time Calculation (min) (ACUTE ONLY): 18 min  Past Medical History:  Past Medical History:  Diagnosis Date  . Aortic atherosclerosis (HCC) 03/21/2017   Chest CTA 03/09/17  . Cataract   . Coronary artery calcification seen on CT scan 03/21/2017   Chest CTA 03/09/17  . History of echocardiogram    Echo 3/19: Moderate LVH, EF 60-65, normal wall motion, grade 1 diastolic dysfunction, mild AI, mildly dilated aortic root (39 mm), trivial MR, PASP 18, no effusion  . History of hypertension    no meds // Diltiazem started in 3/19 for AFib  . History of stroke 01/2017   s/p ILR // AFib noted during re-hosp for pneumonia in 3/19 >> Eliquis started  . PAF (paroxysmal atrial fibrillation) (HCC)   . Stroke [facility](HCC)    Past Surgical History:  Past Surgical History:  Procedure Laterality Date  . Esophageal stretching    . LOOP RECORDER INSERTION N/A 01/22/2017   Procedure: LOOP RECORDER INSERTION;  Surgeon: Marinus Mawaylor, Gregg W, MD;  [facility];  Service: Cardiovascular;  Laterality: N/A;  . TEE WITHOUT CARDIOVERSION N/A 01/22/2017   Procedure: TRANSESOPHAGEAL ECHOCARDIOGRAM (TEE);  Surgeon: Quintella Reicherturner, Traci R, MD;  [facility];  Service: Cardiovascular;  Laterality: N/A;   HPI:  77 year old male presenting with documented fever 1 week and subjective fevers for the past month. PMH:  stroke 2 months ago,  healthcare acquired pneumonia 1 month ago, hypertension, CKD stage II, A. Fib with RVR.  Chest x-ray in the emergency department was negative for any pneumonia.    Assessment / Plan / Recommendation Clinical Impression  Pt exhibited intermittent coughing in between consistencies during evaluation. Pt states his wife thinks he may be aspirating due to frequent coughing  after meals. Mild left sided facial weakness. Given recent stroke and history of pna an MBS is planned for 10:00 today.    SLP Visit Diagnosis: Dysphagia, unspecified (R13.10)    Aspiration Risk  Moderate aspiration risk    Diet Recommendation Regular;Thin liquid   Liquid Administration via: Cup;Straw Medication Administration: Whole meds with liquid Supervision: Patient able to self feed Compensations: Slow rate;Small sips/bites;Minimize environmental distractions Postural Changes: Seated upright at 90 degrees    Other  Recommendations Oral Care Recommendations: Oral care BID   Follow up Recommendations (TBD)      Frequency and Duration            Prognosis        Swallow Study   General HPI: 77 year old male presenting with documented fever 1 week and subjective fevers for the past month. PMH:  stroke 2 months ago,  healthcare acquired pneumonia 1 month ago, hypertension, CKD stage II, A. Fib with RVR.  Chest x-ray in the emergency department was negative for any pneumonia.  Type of Study: Bedside Swallow Evaluation Previous Swallow Assessment: (none) Diet Prior to this Study: Regular;Thin liquids Temperature Spikes Noted: No Respiratory Status: Room air History of Recent Intubation: No Behavior/Cognition: Alert;Cooperative;Pleasant mood Oral Cavity Assessment: Within Functional Limits Oral Care Completed by SLP: No Oral Cavity - Dentition: Dentures, top;Dentures, bottom Vision: Functional for self-feeding Self-Feeding Abilities: Able to feed self Patient Positioning: Upright in bed Baseline Vocal Quality: Normal Volitional Cough: Strong Volitional Swallow: Able to elicit    Oral/Motor/Sensory Function Overall Oral Motor/Sensory Function: Mild impairment  Facial Symmetry: Abnormal symmetry left;Suspected CN VII (facial) dysfunction Facial Strength: Reduced left Facial Sensation: Within Functional Limits Lingual ROM: Within Functional Limits Lingual Symmetry:  Within Functional Limits Lingual Strength: Within Functional Limits Lingual Sensation: Within Functional Limits Velum: Within Functional Limits Mandible: Within Functional Limits   Ice Chips Ice chips: Not tested   Thin Liquid Thin Liquid: Within functional limits(see impression) Presentation: Cup    Nectar Thick Nectar Thick Liquid: Not tested   Honey Thick Honey Thick Liquid: Not tested   Puree Puree: (see impression)   Solid   GO   Solid: (see impressin)        Roque Cash Breck Coons 04/11/2017,9:03 AM   Breck Coons Lonell Face.Ed [employer] [phone]

## 2017-04-11 NOTE — Telephone Encounter (Signed)
Notes recorded by Hildred AlaminMurrell, Tiffannie Sloss Y, RN on 04/11/2017 at 5:24 PM EDT Rn call patients wife on dpr that lab work was unremarkable for memory loss. The wife verbalized understanding. ------

## 2017-04-13 LAB — CULTURE, BLOOD (ROUTINE X 2)
Culture: NO GROWTH
Culture: NO GROWTH
SPECIAL REQUESTS: ADEQUATE
SPECIAL REQUESTS: ADEQUATE

## 2017-04-30 ENCOUNTER — Ambulatory Visit (INDEPENDENT_AMBULATORY_CARE_PROVIDER_SITE_OTHER): Payer: Medicare HMO | Admitting: *Deleted

## 2017-04-30 DIAGNOSIS — I639 Cerebral infarction, unspecified: Secondary | ICD-10-CM | POA: Diagnosis not present

## 2017-04-30 NOTE — Progress Notes (Signed)
Carelink Summary Report / Loop Recorder 

## 2017-05-04 LAB — CUP PACEART REMOTE DEVICE CHECK
Date Time Interrogation Session: 20190326173843
Implantable Pulse Generator Implant Date: 20190121

## 2017-05-08 ENCOUNTER — Other Ambulatory Visit: Payer: Medicare HMO

## 2017-05-10 ENCOUNTER — Other Ambulatory Visit: Payer: Self-pay | Admitting: Internal Medicine

## 2017-05-14 ENCOUNTER — Encounter: Payer: Self-pay | Admitting: Internal Medicine

## 2017-05-25 LAB — CUP PACEART REMOTE DEVICE CHECK
MDC IDC PG IMPLANT DT: 20190121
MDC IDC SESS DTM: 20190428180810

## 2017-06-01 ENCOUNTER — Ambulatory Visit (INDEPENDENT_AMBULATORY_CARE_PROVIDER_SITE_OTHER): Payer: Medicare HMO | Admitting: *Deleted

## 2017-06-01 DIAGNOSIS — I639 Cerebral infarction, unspecified: Secondary | ICD-10-CM | POA: Diagnosis not present

## 2017-06-04 NOTE — Progress Notes (Signed)
Carelink Summary Report / Loop Recorder 

## 2017-06-25 LAB — CUP PACEART REMOTE DEVICE CHECK
Date Time Interrogation Session: 20190531180753
MDC IDC PG IMPLANT DT: 20190121

## 2017-06-26 ENCOUNTER — Ambulatory Visit: Payer: Medicare HMO | Admitting: Adult Health

## 2017-07-04 ENCOUNTER — Ambulatory Visit (INDEPENDENT_AMBULATORY_CARE_PROVIDER_SITE_OTHER): Payer: Medicare HMO | Admitting: *Deleted

## 2017-07-04 DIAGNOSIS — I639 Cerebral infarction, unspecified: Secondary | ICD-10-CM | POA: Diagnosis not present

## 2017-07-06 NOTE — Progress Notes (Signed)
Carelink Summary Report / Loop Recorder 

## 2017-07-09 ENCOUNTER — Other Ambulatory Visit: Payer: Medicare HMO

## 2017-07-12 ENCOUNTER — Ambulatory Visit: Payer: Medicare HMO | Admitting: Adult Health

## 2017-07-17 ENCOUNTER — Ambulatory Visit: Payer: Medicare HMO | Admitting: Neurology

## 2017-07-17 DIAGNOSIS — R410 Disorientation, unspecified: Secondary | ICD-10-CM | POA: Diagnosis not present

## 2017-07-17 DIAGNOSIS — G3184 Mild cognitive impairment, so stated: Secondary | ICD-10-CM

## 2017-07-17 DIAGNOSIS — I63431 Cerebral infarction due to embolism of right posterior cerebral artery: Secondary | ICD-10-CM

## 2017-07-19 ENCOUNTER — Telehealth: Payer: Self-pay | Admitting: Neurology

## 2017-07-19 NOTE — Telephone Encounter (Signed)
Dr.Sethi pt would like results of EEG, and had question about driving.

## 2017-07-19 NOTE — Telephone Encounter (Signed)
Pts wife Bonita QuinLinda requesting a call to discuss pts EEG results. Also would like to discuss the pt being able to drive. Please call to advise

## 2017-07-20 NOTE — Telephone Encounter (Signed)
Kindly inform the patient had EEG study was normal. Patient can drive.

## 2017-07-23 NOTE — Telephone Encounter (Signed)
Rn call patients wife on dpr about eeg. Rn stated the EEG was normal,and pt can drive. THe wife verbalized understanding.

## 2017-07-23 NOTE — Telephone Encounter (Signed)
The wife wanted to cancel appt with Shanda BumpsJessica NP on tomorrow because she already has the results for the EEG. The wife stated she did not want to come just to be told about eeg. Rn stated Dr. Pearlean BrownieSethi likes to see pts from 1 to 2 years for stroke follow up to prevent recurrence. Rn advised wife that her husband is on eliquis too,and we see,and  monitor pts who see the cardiologist for atrial fibrillation. The wife stated she will keep appt.

## 2017-07-24 ENCOUNTER — Encounter: Payer: Self-pay | Admitting: Adult Health

## 2017-07-24 ENCOUNTER — Ambulatory Visit: Payer: Medicare HMO | Admitting: Adult Health

## 2017-07-24 VITALS — BP 131/85 | HR 64 | Wt 218.8 lb

## 2017-07-24 DIAGNOSIS — I10 Essential (primary) hypertension: Secondary | ICD-10-CM

## 2017-07-24 DIAGNOSIS — I63431 Cerebral infarction due to embolism of right posterior cerebral artery: Secondary | ICD-10-CM

## 2017-07-24 DIAGNOSIS — E785 Hyperlipidemia, unspecified: Secondary | ICD-10-CM

## 2017-07-24 DIAGNOSIS — I480 Paroxysmal atrial fibrillation: Secondary | ICD-10-CM | POA: Diagnosis not present

## 2017-07-24 NOTE — Progress Notes (Signed)
I agree with the above plan 

## 2017-07-24 NOTE — Progress Notes (Signed)
[facility] [address] [place]. [place] [address] [phone]       OFFICE FOLLOW-UP NOTE  [name] Date of Birth:  May 04, 1940 Medical Record Number:  [id number]   HPI: [name] is a 77 year old Caucasian male seen today for the first office follow-up visit for in-hospital admission for stroke in January 2019. He is accompanied by his wife and sister. History is obtained from them, review of electronic medical records and have personally reviewed imaging films.  Initial visit 03/28/17 PS: Victor Mcclain an 77 y.o.malepresenting via EMS from home with new onset of dysarthria, left facial droop and left arm weakness. LKN was 9:00 PM on Thursday. He also endorsedleaningtowards theleft with ambulation and speech "slightly off on some words"since this morning.Hiswife noticedthat the patientwas not himself this morning. He states that he is not on ASA or Plavix. He is not on a blood thinner.LSN:9:00 PM on 01/18/2017.tPA Given:No:Out of time window.CT scan of the head showed no acute abnormality. MRI scan showed a small acute infarct in the morning and to the medial right thalamus which was rather than a typical "and was greater than 1 cm in size raising concern for possible embolic etiology. 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 Transthoracic echo showed normal ejection fraction. Carotid ultrasound showed no significant extracranial stenosis. Telemetry monitoring did not reveal evidence of matrix ablation. Consistently aphasia and echocardiogram showed no evidence of clot or PFO. Patient had loop recorder inserted and in fact was found to have paroxysmal A. Fib and a subsequent hospital admission in March 19. Patient stated he was doing well from his stroke and had recovered physically no cognitively he was having issues with the short-term memory deficits getting occasionally confused and frustrated and  requiring constant repetition and having trouble remembering new information. He was struggling with tasks like dialing the telephone number using the TV remote. He has finished physical and occupation therapy. He was rehospitalized with pneumonia and pleural effusion and subsequently was found to have A. Fib on loop recorder. He has been switched to eliquis. His presently following up with cardiology and plans to have a nuclear stress test as well as an echocardiogram done next week hence he had some   pericardial effusion also. He also had some labwork done primary care physician's office which I have reviewed and on 03/19/2017 basic metabolic panel lab was normal vitamin B12 was 741.WBC count was normal.patient says that he has recovered physically but he feels quite fatigued and tired and does not have energy. He has stopped going out and not participating in his previous usual activities.  07/24/17 UPDATE: Patient returns today for follow-up visit and is accompanied by his wife.  He continues to have mild left upper extremity weakness and increased fatigue.  He attempts to stay active but will fatigue quickly feeling as though he needs to take a nap.  Patient states he feels as though he sleeps well throughout the night and denies any issue with sleep prior to his stroke.  Patient does admit to snoring and wife agrees with this and states he was told during hospitalization that he has sleep apnea but this was never tested.  Patient also endorses depression since his stroke but states he will talk to PCP regarding this.  Continues to take Eliquis without bleeding or bruising.  Continues to take Lipitor without side effects of myalgias.  Blood pressure today satisfactory 131/85.  Recent EEG on  07/20/2017 was negative for seizure activity and patient released to drive.  Denies new or worsening stroke/TIA symptoms.  ROS:   14 system review of systems is positive for fatigue and weakness and all other systems  negative PMH:  Past Medical History:  Diagnosis Date  . Aortic atherosclerosis (HCC) 03/21/2017   Chest CTA 03/09/17  . Cataract   . Coronary artery calcification seen on CT scan 03/21/2017   Chest CTA 03/09/17  . History of echocardiogram    Echo 3/19: Moderate LVH, EF 60-65, normal wall motion, grade 1 diastolic dysfunction, mild AI, mildly dilated aortic root (39 mm), trivial MR, PASP 18, no effusion  . History of hypertension    no meds // Diltiazem started in 3/19 for AFib  . History of stroke 01/2017   s/p ILR // AFib noted during re-hosp for pneumonia in 3/19 >> Eliquis started  . PAF (paroxysmal atrial fibrillation) (HCC)   . Stroke [facility])     Social History:  Social History   Socioeconomic History  . Marital status: Married    Spouse name: Not on file  . Number of children: Not on file  . Years of education: Not on file  . Highest education level: Not on file  Occupational History  . Not on file  Social Needs  . Financial resource strain: Not on file  . Food insecurity:    Worry: Not on file    Inability: Not on file  . Transportation needs:    Medical: Not on file    Non-medical: Not on file  Tobacco Use  . Smoking status: Former Smoker    Packs/day: 3.00    Types: Cigarettes    Last attempt to quit: 01/19/1986    Years since quitting: 31.5  . Smokeless tobacco: Former Neurosurgeon    Types: Chew  Substance and Sexual Activity  . Alcohol use: No    Frequency: Never  . Drug use: No  . Sexual activity: Yes  Lifestyle  . Physical activity:    Days per week: Not on file    Minutes per session: Not on file  . Stress: Not on file  Relationships  . Social connections:    Talks on phone: Not on file    Gets together: Not on file    Attends religious service: Not on file    Active member of club or organization: Not on file    Attends meetings of clubs or organizations: Not on file    Relationship status: Not on file  . Intimate partner violence:    Fear of current  or ex partner: Not on file    Emotionally abused: Not on file    Physically abused: Not on file    Forced sexual activity: Not on file  Other Topics Concern  . Not on file  Social History Narrative  . Not on file    Medications:   Current Outpatient Medications on File Prior to Visit  Medication Sig Dispense Refill  . amiodarone (PACERONE) 200 MG tablet Take by mouth.    Marland Kitchen apixaban (ELIQUIS) 5 MG TABS tablet Take 1 tablet (5 mg total) by mouth 2 (two) times daily. 60 tablet 0  . atorvastatin (LIPITOR) 40 MG tablet Take 1 tablet (40 mg total) by mouth daily at 6 PM. 30 tablet 2  . dextromethorphan-guaiFENesin (MUCINEX DM) 30-600 MG 12hr tablet Take 1 tablet by mouth 2 (two) times daily as needed for cough. 30 tablet 0  . diltiazem (CARDIZEM CD) 120  MG 24 hr capsule Take 1 capsule (120 mg total) by mouth daily. 30 capsule 0  . glucosamine-chondroitin 500-400 MG tablet Take 1 tablet by mouth daily.    . metoprolol tartrate (LOPRESSOR) 25 MG tablet   0  . Multiple Vitamin (MULTIVITAMIN) tablet Take 1 tablet by mouth daily.    . ranitidine (ZANTAC) 150 MG tablet Take by mouth.     No current facility-administered medications on file prior to visit.     Allergies:   Allergies  Allergen Reactions  . Lidocaine Hcl Anaphylaxis    Per Patient he is allergic to all medications ending in "caine", refused Tetracaine  . Novocain [Procaine] Anaphylaxis    And any similar meds  . Other Anaphylaxis    Any med ending in -caine  . Tape Other (See Comments)    Patient prefers paper tape  . Vancomycin Other (See Comments)    IV caused redness and burning in the arm; at site    Physical Exam General: well developed, well nourished elderly Caucasian male, seated, in no evident distress Head: head normocephalic and atraumatic.  Neck: supple with no carotid or supraclavicular bruits Cardiovascular: regular rate and rhythm, no murmurs Musculoskeletal: no deformity Skin:  no  rash/petichiae Vascular:  Normal pulses all extremities Vitals:   07/24/17 1255  BP: 131/85  Pulse: 64   Neurologic Exam Mental Status: Awake and fully alert. Oriented to place and time. Recent and remote memory intact. Attention span, concentration and fund of knowledge appropriate. Mood and affect appropriate. Cranial Nerves: Fundoscopic exam reveals sharp disc margins. Pupils equal, briskly reactive to light. Extraocular movements full without nystagmus. Visual fields full to confrontation. Hearing slightly diminished. Facial sensation intact. Face, tongue, palate moves normally and symmetrically.  Motor: Normal bulk and tone. Normal strength in all tested extremity muscles except for mild left upper extremity triceps weakness.  Diminished fine finger movements on the left. Orbits right over left upper extremity Sensory.: intact to touch ,pinprick .position and vibratory sensation.  Coordination: Rapid alternating movements normal in all extremities. Finger-to-nose and heel-to-shin performed accurately bilaterally. Gait and Station: Arises from chair without difficulty. Stance is normal. Gait demonstrates normal stride length and balance . Able to heel, toe and tandem walk with some difficulty.  Reflexes: 1+ and symmetric. Toes downgoing.     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 Mild vascular cognitive impairment poststroke..Vascular risk factors of hypertension, hyperlipidemia, atrial fibrillation, age.  Patient returns today for follow-up and does have continued complaints of excessive fatigue.    PLAN: -Continue Eliquis (apixaban) daily  and lipitor  for secondary stroke prevention -F/u with PCP regarding your HLD and HTN management -cardiologist for atrial fibrillation and Eliquis management  -Patient requested follow-up with PCP in regards to being tested for OSA and  depression management -continue to monitor BP at home -Maintain strict control of hypertension with blood pressure goal below 130/90, diabetes with hemoglobin A1c goal below 6.5% and cholesterol with LDL cholesterol (bad cholesterol) goal below 70 mg/dL. I also advised the patient to eat a healthy diet with plenty of whole grains, cereals, fruits and vegetables, exercise regularly and maintain ideal body weight.  Follow up in 6 months or call earlier if needed  Greater than 50% of time during this 25 minute visit was spent on counseling,explanation of diagnosis embolic stroke, atrial fibrillation, mild cognitive impairment, planning of further management, discussion with patient and family and coordination  of care  George Hugh, AGNP-BC  [facility] [address] [place], [place] [address]  Phone [phone] Fax [phone] Note: This document was prepared with digital dictation and possible smart phrase technology. Any transcriptional errors that result from this process are unintentional.

## 2017-07-24 NOTE — Patient Instructions (Signed)
Continue Eliquis (apixaban) daily  and lipitor  for secondary stroke prevention  Continue to follow up with PCP regarding cholesterol and blood pressure management   Follow up with Dr. Virginia Rochesterrr for sleep apnea testing  Continue to monitor blood pressure at home  Continue to stay active and eat a healthy diet  Maintain strict control of hypertension with blood pressure goal below 130/90, diabetes with hemoglobin A1c goal below 6.5% and cholesterol with LDL cholesterol (bad cholesterol) goal below 70 mg/dL. I also advised the patient to eat a healthy diet with plenty of whole grains, cereals, fruits and vegetables, exercise regularly and maintain ideal body weight.  Followup in the future with me in 6 months or call earlier if needed       [facility] you [facility]. I hope we have been able to provide you high quality care today.  You may receive a patient satisfaction survey over the next few weeks. We would appreciate your feedback and comments so that we may continue to improve ourselves and the health of our patients.

## 2017-08-06 ENCOUNTER — Ambulatory Visit (INDEPENDENT_AMBULATORY_CARE_PROVIDER_SITE_OTHER): Payer: Medicare HMO | Admitting: *Deleted

## 2017-08-06 DIAGNOSIS — I639 Cerebral infarction, unspecified: Secondary | ICD-10-CM | POA: Diagnosis not present

## 2017-08-07 NOTE — Progress Notes (Signed)
Carelink Summary Report / Loop Recorder 

## 2017-08-10 LAB — CUP PACEART REMOTE DEVICE CHECK
Implantable Pulse Generator Implant Date: 20190121
MDC IDC SESS DTM: 20190703180827

## 2017-09-10 ENCOUNTER — Ambulatory Visit (INDEPENDENT_AMBULATORY_CARE_PROVIDER_SITE_OTHER): Payer: Medicare HMO | Admitting: *Deleted

## 2017-09-10 DIAGNOSIS — I639 Cerebral infarction, unspecified: Secondary | ICD-10-CM

## 2017-09-10 NOTE — Progress Notes (Signed)
Carelink Summary Report / Loop Recorder 

## 2017-09-18 LAB — CUP PACEART REMOTE DEVICE CHECK
Implantable Pulse Generator Implant Date: 20190121
MDC IDC SESS DTM: 20190805180643

## 2017-09-28 LAB — CUP PACEART REMOTE DEVICE CHECK
Implantable Pulse Generator Implant Date: 20190121
MDC IDC SESS DTM: 20190907183953

## 2017-10-11 ENCOUNTER — Ambulatory Visit (INDEPENDENT_AMBULATORY_CARE_PROVIDER_SITE_OTHER): Payer: Medicare HMO | Admitting: *Deleted

## 2017-10-11 DIAGNOSIS — I639 Cerebral infarction, unspecified: Secondary | ICD-10-CM

## 2017-10-15 NOTE — Progress Notes (Signed)
Carelink Summary Report / Loop Recorder 

## 2017-10-26 LAB — CUP PACEART REMOTE DEVICE CHECK
Implantable Pulse Generator Implant Date: 20190121
MDC IDC SESS DTM: 20191010194055

## 2018-01-22 ENCOUNTER — Ambulatory Visit: Payer: Medicare HMO | Admitting: Adult Health

## 2018-02-26 ENCOUNTER — Ambulatory Visit: Payer: Medicare HMO | Admitting: Adult Health

## 2018-05-20 IMAGING — DX DG CHEST 2V
2 series · 2 of 2 positions shown · non-contrast
Comparison: 01/20/2017 chest radiographs

CLINICAL DATA: LEFT chest pain.

EXAM:
CHEST - 2 VIEW

[chest pa]
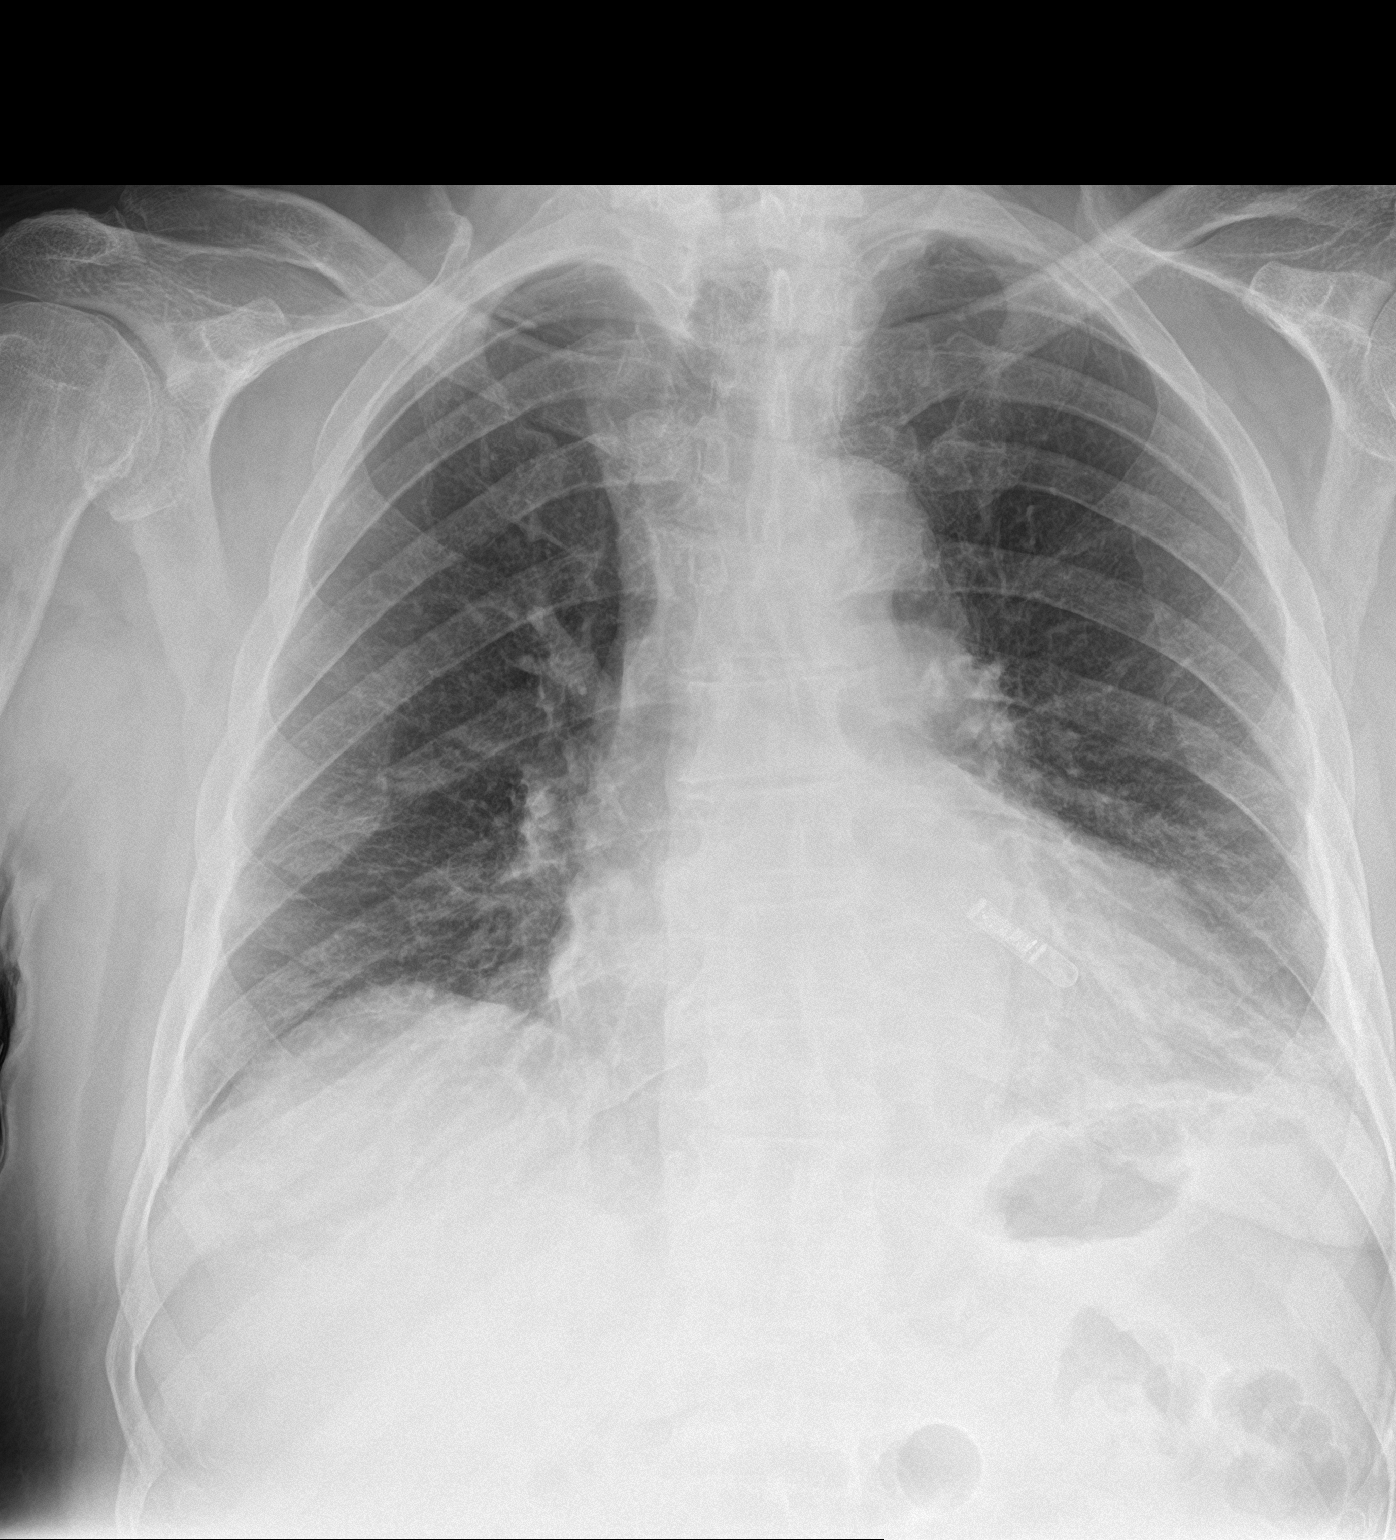

[chest lat]
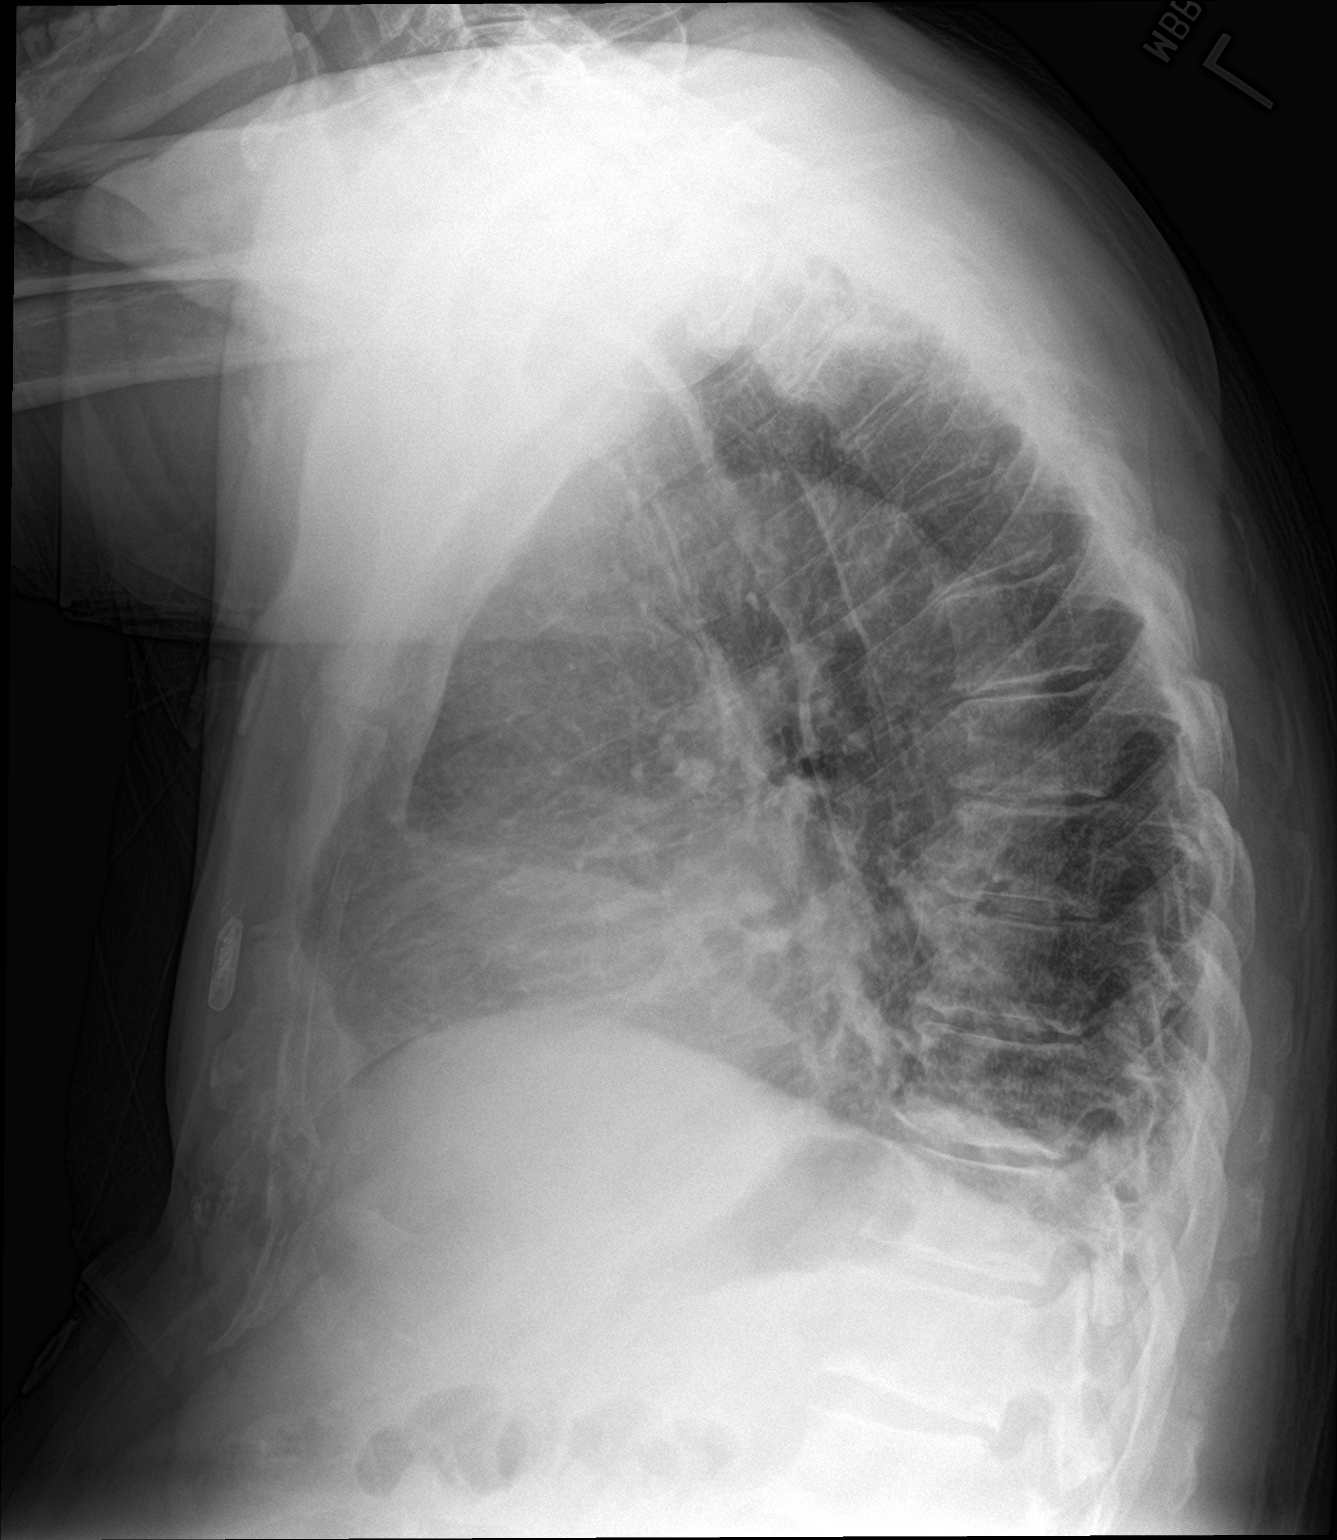

[2 of 2 positions shown; findings below may reference images not displayed]

FINDINGS: Mild cardiomegaly noted.

A loop recorder is present.

Bibasilar opacities are identified-question atelectasis versus
airspace disease.

There is no evidence of pulmonary edema, mass, pleural effusion or
pneumothorax.

No acute bony abnormalities are identified.
IMPRESSION: Bibasilar opacities-question atelectasis versus airspace
disease/pneumonia.

## 2019-03-11 DIAGNOSIS — I480 Paroxysmal atrial fibrillation: Secondary | ICD-10-CM | POA: Diagnosis not present

## 2019-03-11 DIAGNOSIS — I700 Atherosclerosis of aorta: Secondary | ICD-10-CM | POA: Diagnosis not present

## 2019-03-11 DIAGNOSIS — I2510 Atherosclerotic heart disease of native coronary artery without angina pectoris: Secondary | ICD-10-CM | POA: Diagnosis not present

## 2019-03-11 DIAGNOSIS — I129 Hypertensive chronic kidney disease with stage 1 through stage 4 chronic kidney disease, or unspecified chronic kidney disease: Secondary | ICD-10-CM | POA: Diagnosis not present

## 2019-04-08 DIAGNOSIS — I361 Nonrheumatic tricuspid (valve) insufficiency: Secondary | ICD-10-CM | POA: Diagnosis not present

## 2019-04-08 DIAGNOSIS — I517 Cardiomegaly: Secondary | ICD-10-CM | POA: Diagnosis not present

## 2019-04-22 DIAGNOSIS — I129 Hypertensive chronic kidney disease with stage 1 through stage 4 chronic kidney disease, or unspecified chronic kidney disease: Secondary | ICD-10-CM | POA: Diagnosis not present

## 2019-04-22 DIAGNOSIS — N183 Chronic kidney disease, stage 3 unspecified: Secondary | ICD-10-CM | POA: Diagnosis not present

## 2019-04-22 DIAGNOSIS — M25562 Pain in left knee: Secondary | ICD-10-CM | POA: Diagnosis not present

## 2019-04-22 DIAGNOSIS — M25561 Pain in right knee: Secondary | ICD-10-CM | POA: Diagnosis not present

## 2019-04-22 DIAGNOSIS — M2242 Chondromalacia patellae, left knee: Secondary | ICD-10-CM | POA: Diagnosis not present

## 2019-04-22 DIAGNOSIS — I69354 Hemiplegia and hemiparesis following cerebral infarction affecting left non-dominant side: Secondary | ICD-10-CM | POA: Diagnosis not present

## 2019-04-22 DIAGNOSIS — M2241 Chondromalacia patellae, right knee: Secondary | ICD-10-CM | POA: Diagnosis not present

## 2019-04-25 DIAGNOSIS — J984 Other disorders of lung: Secondary | ICD-10-CM | POA: Diagnosis not present

## 2019-04-25 DIAGNOSIS — Z79899 Other long term (current) drug therapy: Secondary | ICD-10-CM | POA: Diagnosis not present

## 2019-04-25 DIAGNOSIS — R0602 Shortness of breath: Secondary | ICD-10-CM | POA: Diagnosis not present

## 2019-05-07 DIAGNOSIS — I639 Cerebral infarction, unspecified: Secondary | ICD-10-CM | POA: Diagnosis not present

## 2019-05-21 DIAGNOSIS — L821 Other seborrheic keratosis: Secondary | ICD-10-CM | POA: Diagnosis not present

## 2019-08-05 DIAGNOSIS — Z9889 Other specified postprocedural states: Secondary | ICD-10-CM | POA: Diagnosis not present

## 2019-08-22 DIAGNOSIS — J029 Acute pharyngitis, unspecified: Secondary | ICD-10-CM | POA: Diagnosis not present

## 2019-08-22 DIAGNOSIS — J1189 Influenza due to unidentified influenza virus with other manifestations: Secondary | ICD-10-CM | POA: Diagnosis not present

## 2019-08-22 DIAGNOSIS — Z20822 Contact with and (suspected) exposure to covid-19: Secondary | ICD-10-CM | POA: Diagnosis not present

## 2019-08-22 DIAGNOSIS — R05 Cough: Secondary | ICD-10-CM | POA: Diagnosis not present

## 2019-09-05 DIAGNOSIS — I639 Cerebral infarction, unspecified: Secondary | ICD-10-CM | POA: Diagnosis not present

## 2019-10-18 DIAGNOSIS — H60501 Unspecified acute noninfective otitis externa, right ear: Secondary | ICD-10-CM | POA: Diagnosis not present

## 2019-10-24 DIAGNOSIS — Z95818 Presence of other cardiac implants and grafts: Secondary | ICD-10-CM | POA: Diagnosis not present

## 2019-10-24 DIAGNOSIS — I700 Atherosclerosis of aorta: Secondary | ICD-10-CM | POA: Diagnosis not present

## 2019-10-24 DIAGNOSIS — R001 Bradycardia, unspecified: Secondary | ICD-10-CM | POA: Diagnosis not present

## 2019-10-24 DIAGNOSIS — I480 Paroxysmal atrial fibrillation: Secondary | ICD-10-CM | POA: Diagnosis not present

## 2019-10-24 DIAGNOSIS — Z7901 Long term (current) use of anticoagulants: Secondary | ICD-10-CM | POA: Diagnosis not present

## 2019-10-24 DIAGNOSIS — I6990 Unspecified sequelae of unspecified cerebrovascular disease: Secondary | ICD-10-CM | POA: Diagnosis not present

## 2019-10-24 DIAGNOSIS — I726 Aneurysm of vertebral artery: Secondary | ICD-10-CM | POA: Diagnosis not present

## 2019-10-24 DIAGNOSIS — R0600 Dyspnea, unspecified: Secondary | ICD-10-CM | POA: Diagnosis not present

## 2019-10-24 DIAGNOSIS — Q211 Atrial septal defect: Secondary | ICD-10-CM | POA: Diagnosis not present

## 2019-10-24 DIAGNOSIS — E782 Mixed hyperlipidemia: Secondary | ICD-10-CM | POA: Diagnosis not present

## 2019-10-24 DIAGNOSIS — R040 Epistaxis: Secondary | ICD-10-CM | POA: Diagnosis not present

## 2019-10-24 DIAGNOSIS — N1831 Chronic kidney disease, stage 3a: Secondary | ICD-10-CM | POA: Diagnosis not present

## 2019-12-07 DIAGNOSIS — I639 Cerebral infarction, unspecified: Secondary | ICD-10-CM | POA: Diagnosis not present

## 2019-12-08 DIAGNOSIS — J984 Other disorders of lung: Secondary | ICD-10-CM | POA: Diagnosis not present

## 2019-12-08 DIAGNOSIS — R062 Wheezing: Secondary | ICD-10-CM | POA: Diagnosis not present

## 2019-12-09 DIAGNOSIS — B351 Tinea unguium: Secondary | ICD-10-CM | POA: Diagnosis not present

## 2019-12-09 DIAGNOSIS — L603 Nail dystrophy: Secondary | ICD-10-CM | POA: Diagnosis not present
